# Patient Record
Sex: Female | Born: 1990
Health system: Southern US, Community
[De-identification: ages and names within clinical notes are randomized; demographics above are authoritative.]

## PROBLEM LIST (undated history)

## (undated) DIAGNOSIS — Z87440 Personal history of urinary (tract) infections: Secondary | ICD-10-CM

## (undated) DIAGNOSIS — N946 Dysmenorrhea, unspecified: Secondary | ICD-10-CM

## (undated) HISTORY — DX: Personal history of urinary (tract) infections: Z87.440

## (undated) HISTORY — DX: Dysmenorrhea, unspecified: N94.6

## (undated) HISTORY — PX: NO PAST SURGERIES: SHX2092

---

## 2012-06-09 ENCOUNTER — Other Ambulatory Visit (HOSPITAL_COMMUNITY)
Admission: RE | Admit: 2012-06-09 | Discharge: 2012-06-09 | Disposition: A | Discharge status: Home / Self Care | Payer: BC Managed Care – PPO | Source: Ambulatory Visit | Attending: Internal Medicine | Admitting: Internal Medicine

## 2012-06-09 DIAGNOSIS — Z01419 Encounter for gynecological examination (general) (routine) without abnormal findings: Secondary | ICD-10-CM | POA: Insufficient documentation

## 2012-06-09 DIAGNOSIS — R87810 Cervical high risk human papillomavirus (HPV) DNA test positive: Secondary | ICD-10-CM | POA: Insufficient documentation

## 2012-06-09 DIAGNOSIS — Z1151 Encounter for screening for human papillomavirus (HPV): Secondary | ICD-10-CM | POA: Insufficient documentation

## 2012-12-30 ENCOUNTER — Encounter (HOSPITAL_COMMUNITY): Payer: Self-pay | Admitting: Emergency Medicine

## 2012-12-30 ENCOUNTER — Emergency Department (HOSPITAL_COMMUNITY)
Admission: EM | Admit: 2012-12-30 | Discharge: 2012-12-30 | Disposition: A | Discharge status: Home / Self Care | Payer: BC Managed Care – PPO | Attending: Emergency Medicine | Admitting: Emergency Medicine

## 2012-12-30 DIAGNOSIS — Y939 Activity, unspecified: Secondary | ICD-10-CM | POA: Insufficient documentation

## 2012-12-30 DIAGNOSIS — IMO0001 Reserved for inherently not codable concepts without codable children: Secondary | ICD-10-CM | POA: Insufficient documentation

## 2012-12-30 DIAGNOSIS — IMO0002 Reserved for concepts with insufficient information to code with codable children: Secondary | ICD-10-CM | POA: Insufficient documentation

## 2012-12-30 DIAGNOSIS — W57XXXA Bitten or stung by nonvenomous insect and other nonvenomous arthropods, initial encounter: Secondary | ICD-10-CM

## 2012-12-30 DIAGNOSIS — Y929 Unspecified place or not applicable: Secondary | ICD-10-CM | POA: Insufficient documentation

## 2012-12-30 NOTE — ED Notes (Signed)
Pt. reports itchy insect bites at right 4th toe and left arm last Saturday.

## 2012-12-30 NOTE — ED Provider Notes (Signed)
CSN: [id number]     Arrival date & time 12/30/12  2210 History   First MD Initiated Contact with Patient 12/30/12 2243     Chief Complaint  Patient presents with  . Insect Bite   (Consider location/radiation/quality/duration/timing/severity/associated sxs/prior Treatment) HPI Comments: 22 year old female presents to the emergency department complaining of bug bites to her left arm and right fourth toe x3 days. Patient states 3 days ago she began itching on her left arm and right toe,  Believes she was bit by something. States there are 3 small bumps on her left arm that are very itchy along with the top of her right fourth toe, he became slightly red, she applied topical hydrocortisone cream with some relief. Denies any new soaps, detergents, pets, lotions or recent travel. No contacts with similar rash. Denies any difficulty breathing or swallowing.  The history is provided by the patient.    History reviewed. No pertinent past medical history. History reviewed. No pertinent past surgical history. No family history on file. History  Substance Use Topics  . Smoking status: Never Smoker   . Smokeless tobacco: Not on file  . Alcohol Use: No   OB History   Grav Para Term Preterm Abortions TAB SAB Ect Mult Living                 Review of Systems  Constitutional: Negative for fever and chills.  Musculoskeletal: Negative for myalgias and arthralgias.  Skin: Positive for rash.  All other systems reviewed and are negative.    Allergies  Review of patient's allergies indicates no known allergies.  Home Medications   Current Outpatient Rx  Name  Route  Sig  Dispense  Refill  . Ibuprofen (IBU PO)   Oral   Take 2 tablets by mouth once.          BP 137/96  Pulse 88  Temp(Src) 98.3 F (36.8 C) (Oral)  Resp 14  Ht 5\' 7"  (1.702 m)  Wt 174 lb (78.926 kg)  BMI 27.25 kg/m2  SpO2 100%  LMP 12/01/2012 Physical Exam  Constitutional: She is oriented to person, place, and time.  She appears well-developed and well-nourished. No distress.  HENT:  Head: Normocephalic and atraumatic.  Mouth/Throat: Oropharynx is clear and moist.  Eyes: Conjunctivae are normal.  Neck: Normal range of motion. Neck supple.  Cardiovascular: Normal rate, regular rhythm and normal heart sounds.   Pulmonary/Chest: Effort normal and breath sounds normal.  Musculoskeletal: Normal range of motion. She exhibits no edema.  Neurological: She is alert and oriented to person, place, and time.  Skin: Skin is warm and dry. She is not diaphoretic.  Three approximately 2 mm raised areas consistent with mosquito bite on left arm, similar area on right fourth toe with mild swelling. No tenderness or surrounding cellulitis.  Psychiatric: She has a normal mood and affect. Her behavior is normal.    ED Course  Procedures (including critical care time) Labs Review Labs Reviewed - No data to display Imaging Review No results found.  MDM   1. Mosquito bite    Patient with mosquito bites. Advised continued hydrocortisone cream as she has been getting relief with this. Also discussed Benadryl and cool compresses for symptomatic relief. Return precautions discussed. Patient states understanding of plan and is agreeable.   Trevor Mace, PA-C 12/30/12 2307

## 2012-12-31 NOTE — ED Provider Notes (Signed)
Medical screening examination/treatment/procedure(s) were performed by non-physician practitioner and as supervising physician I was immediately available for consultation/collaboration.   Lakelyn Straus, MD 12/31/12 0512 

## 2016-07-29 ENCOUNTER — Ambulatory Visit: Payer: Self-pay | Admitting: Family Medicine

## 2016-08-15 ENCOUNTER — Encounter: Payer: Self-pay | Admitting: Family Medicine

## 2016-08-15 ENCOUNTER — Ambulatory Visit (INDEPENDENT_AMBULATORY_CARE_PROVIDER_SITE_OTHER): Payer: 59 | Admitting: Family Medicine

## 2016-08-15 VITALS — BP 120/80 | HR 81 | Resp 12 | Ht 67.0 in | Wt 196.4 lb

## 2016-08-15 DIAGNOSIS — N946 Dysmenorrhea, unspecified: Secondary | ICD-10-CM

## 2016-08-15 DIAGNOSIS — G4700 Insomnia, unspecified: Secondary | ICD-10-CM

## 2016-08-15 DIAGNOSIS — H18012 Anterior corneal pigmentations, left eye: Secondary | ICD-10-CM | POA: Diagnosis not present

## 2016-08-15 DIAGNOSIS — E669 Obesity, unspecified: Secondary | ICD-10-CM | POA: Diagnosis not present

## 2016-08-15 DIAGNOSIS — Z6830 Body mass index (BMI) 30.0-30.9, adult: Secondary | ICD-10-CM | POA: Diagnosis not present

## 2016-08-15 MED ORDER — IBUPROFEN 600 MG PO TABS: 600.0000 mg | ORAL_TABLET | Freq: Three times a day (TID) | ORAL | 1 refills | Status: DC | PRN

## 2016-08-15 NOTE — Progress Notes (Signed)
HPI:   [name] is a 26 y.o. female, who is here today to establish care.  Former PCP: Dr Fredric Mare Last preventive routine visit: 07/2014.  Chronic medical problems: Otherwise healthy.   Concerns today:   According to pt, her mother recommended to have thyroid check because she is having a hard time losing wt.  She stated following a healthier diet about 6 weeks ago. In 05/2016 she started with regular exercise. Some examples of foods:  Breakfast: She usually does not eat breakfast, smoothly sometimes. Lunch: subway sandwich, chicken and spinach (she cooked it), yesterday chick filet grilled chicken nuggets. Dinner: Spaghetti with hamburger and tomatoes, [place] burger, salad with chicken and avocado.  Snacks: catchus,sunflower seeds, pretsals, and a lot fruit.   She has lost about 11 Lb since she changed life style habits.   She is also c/o trouble falling asleep, once she is asleep she does not wake up. She has not tried OTC sleep aids. Denies fatigue.   -She is also reporting abnormal pap 2 years ago. She denies abdominal pain, abnormal vaginal bleeding or discharge. Hx of menstrual cramps on day 1 and 2 of her menstrual period, flow is mildly heavy , no clots. She was on OCP's 1-2 years ago and helped.    Review of Systems  Constitutional: Negative for activity change, appetite change, fatigue and fever.  HENT: Negative for mouth sores, nosebleeds and trouble swallowing.   Eyes: Negative for redness and visual disturbance.  Respiratory: Negative for shortness of breath and wheezing.   Cardiovascular: Negative for palpitations and leg swelling.  Gastrointestinal: Negative for abdominal pain, nausea and vomiting.       Negative for changes in bowel habits.  Endocrine: Negative for cold intolerance and heat intolerance.  Genitourinary: Positive for menstrual problem and pelvic pain. Negative for decreased urine volume, dysuria and hematuria.    Musculoskeletal: Negative for back pain and myalgias.  Skin: Negative for pallor and rash.  Neurological: Negative for syncope, weakness and headaches.  Psychiatric/Behavioral: Positive for sleep disturbance. The patient is not nervous/anxious.      No current outpatient prescriptions on file prior to visit.   No current facility-administered medications on file prior to visit.      Past Medical History:  Diagnosis Date  . History of frequent urinary tract infections    No Known Allergies  Family History  Problem Relation Age of Onset  . Hypertension Paternal Uncle   . Diabetes Neg Hx   . Cancer Neg Hx     Social History   Social History  . Marital status: Single    Spouse name: N/A  . Number of children: N/A  . Years of education: N/A   Social History Main Topics  . Smoking status: Never Smoker  . Smokeless tobacco: Never Used  . Alcohol use No  . Drug use: No  . Sexual activity: Not Asked   Other Topics Concern  . None   Social History Narrative  . None    Vitals:   08/15/16 1542  BP: 120/80  Pulse: 81  Resp: 12  O2 sat at RA 98%. Body mass index is 30.76 kg/m.   Physical Exam  Nursing note and vitals reviewed. Constitutional: She is oriented to person, place, and time. She appears well-developed. No distress.  HENT:  Head: Atraumatic.  Mouth/Throat: Oropharynx is clear and moist and mucous membranes are normal.  Eyes: Conjunctivae and EOM are normal. Pupils are equal, round, and reactive  to light.    Left eye pigmentation, at 4 O'clock  Neck: No tracheal deviation present. No thyroid mass and no thyromegaly present.  Cardiovascular: Normal rate and regular rhythm.   No murmur heard. Pulses:      Dorsalis pedis pulses are 2+ on the right side, and 2+ on the left side.  Respiratory: Effort normal and breath sounds normal. No respiratory distress.  GI: Soft. She exhibits no mass. There is no hepatomegaly. There is no tenderness.   Musculoskeletal: She exhibits no edema.  Lymphadenopathy:    She has no cervical adenopathy.  Neurological: She is alert and oriented to person, place, and time. She has normal strength. Gait normal.  Skin: Skin is warm. No rash noted. No erythema.  Psychiatric: She has a normal mood and affect.  Well groomed, poor eye contact.       ASSESSMENT AND PLAN:   [name] was seen today for establish care.  Diagnoses and all orders for this visit:  Lab Results  Component Value Date   TSH 0.82 08/15/2016    Insomnia, unspecified type  Good sleep hygiene discussed. Melatonin OTC 5 mg may also help.  Class 1 obesity without serious comorbidity with body mass index (BMI) of 30.0 to 30.9 in adult, unspecified obesity type  We discussed benefits of wt loss as well as adverse effects of obesity. Consistency with healthy diet and physical activity recommended. She is reporting 11 lb wt loss already, so encouraged to continue with life style changes. F/U in 3 months.   -     TSH  Dysmenorrhea  Treatment options discussed: HT or NSAID's, she prefers the latter one. We discussed some side effects of medication. F/U in 3 months.  -     ibuprofen (ADVIL,MOTRIN) 600 MG tablet; Take 1 tablet (600 mg total) by mouth every 8 (eight) hours as needed for cramping (with menses for 3 days, with food).  Corneal pigmentation of left eye  We discussed findings on exam, she is not sure about duration. Strongly recommend arranging eye exam, she is reporting one done about 2 years ago, she does not recall any conversation about pigmentation.     Betty G. Sw[place]il[place]Jordan, MD  [facility]. [place] office.

## 2016-08-15 NOTE — Patient Instructions (Signed)
A few things to remember from today's visit:   Class 1 obesity without serious comorbidity with body mass index (BMI) of 30.0 to 30.9 in adult, unspecified obesity type - Plan: TSH  Dysmenorrhea - Plan: ibuprofen (ADVIL,MOTRIN) 600 MG tablet  Ibuprofen with menstrual periods and with food.  Please be sure medication list is accurate. If a new problem present, please set up appointment sooner than planned today.

## 2016-08-16 LAB — TSH: TSH: 0.82 u[IU]/mL (ref 0.35–4.50)

## 2016-08-21 ENCOUNTER — Telehealth: Payer: Self-pay | Admitting: General Practice

## 2016-08-21 NOTE — Telephone Encounter (Signed)
She can have a note for today and tomorrow. Treatment was started last OV, keep f/u appt, come earlier if needed to discuss other options.  Thanks, BJ

## 2016-08-21 NOTE — Telephone Encounter (Signed)
Pt would like to have a note for the monthly pain that she is experiencing during her period this was discussed with Dr. SwazilandJordan on Thursday.  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

## 2016-08-21 NOTE — Telephone Encounter (Signed)
Okay for letter

## 2016-08-22 NOTE — Telephone Encounter (Signed)
Letter placed up front for patient to pick up. Patient already made aware it will be up front.

## 2016-09-23 ENCOUNTER — Ambulatory Visit (INDEPENDENT_AMBULATORY_CARE_PROVIDER_SITE_OTHER): Payer: 59 | Admitting: Family Medicine

## 2016-09-23 ENCOUNTER — Encounter: Payer: Self-pay | Admitting: Family Medicine

## 2016-09-23 VITALS — BP 110/76 | HR 80 | Resp 12 | Ht 67.0 in | Wt 195.4 lb

## 2016-09-23 DIAGNOSIS — N946 Dysmenorrhea, unspecified: Secondary | ICD-10-CM

## 2016-09-23 NOTE — Progress Notes (Signed)
HPI:   ACUTE VISIT:  No chief complaint on file.   [name] is a 26 y.o. female, who is here today complaining of pelvic pain that started 1-2 days ago and related with her menstrual period. She has history of dysmenorrhea, last office visit was on 08/15/2016. She did not try Ibuprofen as I recommended last OV. She has taken OCP's in the past and it helped but she is not interested in daily medication and denies sexual activity. She took OTC Pamper at the time pain started, help some.  She had an episode of nausea and vomiting yesterday.  Abdominal Cramping  This is a chronic problem. The current episode started yesterday. The onset quality is gradual. The most recent episode lasted 2 days. The problem has been gradually improving. The pain is located in the suprapubic region. The pain is at a severity of 7/10. The pain is moderate. The quality of the pain is cramping and colicky. The abdominal pain does not radiate. Associated symptoms include nausea and vomiting. Pertinent negatives include no diarrhea, dysuria, fever, frequency, hematochezia or hematuria. The pain is aggravated by palpation. The pain is relieved by nothing. The treatment provided mild relief.   She denies heavy menstrual flow. Suprapubic abdominal pain started on 09/21/16 night and worse yesterday, "much better" today. Symptoms are her typical ones almost every month with menstrual periods. Usually worse the first day of her menstrual period, last 2-3 days.  She is here today because she needs a note to for work, she missed work yesterday.   Review of Systems  Constitutional: Positive for fatigue. Negative for appetite change, chills, fever and unexpected weight change.  Respiratory: Negative for chest tightness and shortness of breath.   Cardiovascular: Negative for leg swelling.  Gastrointestinal: Positive for abdominal pain, nausea and vomiting. Negative for abdominal distention, blood in stool,  diarrhea and hematochezia.  Genitourinary: Positive for pelvic pain. Negative for decreased urine volume, dysuria, frequency, hematuria and vaginal discharge.  Skin: Negative for pallor and rash.  Hematological: Negative for adenopathy. Does not bruise/bleed easily.      Current Outpatient Prescriptions on File Prior to Visit  Medication Sig Dispense Refill  . ibuprofen (ADVIL,MOTRIN) 600 MG tablet Take 1 tablet (600 mg total) by mouth every 8 (eight) hours as needed for cramping (with menses for 3 days, with food). 30 tablet 1   No current facility-administered medications on file prior to visit.      Past Medical History:  Diagnosis Date  . History of frequent urinary tract infections    No Known Allergies  Social History   Social History  . Marital status: Single    Spouse name: N/A  . Number of children: N/A  . Years of education: N/A   Social History Main Topics  . Smoking status: Never Smoker  . Smokeless tobacco: Never Used  . Alcohol use No  . Drug use: No  . Sexual activity: Not Asked   Other Topics Concern  . None   Social History Narrative  . None    Vitals:   09/23/16 1049  BP: 110/76  Pulse: 80  Resp: 12   Body mass index is 30.6 kg/m.  Physical Exam  Nursing note and vitals reviewed. Constitutional: She is oriented to person, place, and time. She appears well-developed. She does not appear ill. No distress.  HENT:  Head: Atraumatic.  Mouth/Throat: Oropharynx is clear and moist and mucous membranes are normal.  Eyes: Conjunctivae are normal.  Cardiovascular: Normal rate and regular rhythm.   No murmur heard. Respiratory: Effort normal and breath sounds normal. No respiratory distress.  GI: Soft. She exhibits no distension and no mass. There is no hepatomegaly. There is no tenderness. There is no CVA tenderness.  Musculoskeletal: She exhibits no edema.  Neurological: She is alert and oriented to person, place, and time. She has normal  strength. Gait normal.  Skin: Skin is warm. No erythema.  Psychiatric: She has a normal mood and affect.  Well groomed, good eye contact.    ASSESSMENT AND PLAN:   Diagnoses and all orders for this visit:  Dysmenorrhea   Educated about diagnosis and treatment options. She still is not interested in OCPs, she would like to try prescription for Ibuprofen, which was sent to the pharmacy last month. She denies sexual activity and describing this episode as her typical pelvic pain with menses, so no further workup was recommended. She was instructed to let me know if she needs a work note for future episodes, usually she needs to be off once per month.  Instructed about warning signs.     Tracie Knight G. [place], MD  [facility]. [place] office.

## 2016-09-23 NOTE — Patient Instructions (Signed)
A few things to remember from today's visit:   Dysmenorrhea Dysmenorrhea Menstrual cramps (dysmenorrhea) are caused by the muscles of the uterus tightening (contracting) during a menstrual period. For some women, this discomfort is merely bothersome. For others, dysmenorrhea can be severe enough to interfere with everyday activities for a few days each month. Primary dysmenorrhea is menstrual cramps that last a couple of days when you start having menstrual periods or soon after. This often begins after a teenager starts having her period. As a woman gets older or has a baby, the cramps will usually lessen or disappear. Secondary dysmenorrhea begins later in life, lasts longer, and the pain may be stronger than primary dysmenorrhea. The pain may start before the period and last a few days after the period. What are the causes? Dysmenorrhea is usually caused by an underlying problem, such as:  The tissue lining the uterus grows outside of the uterus in other areas of the body (endometriosis).  The endometrial tissue, which normally lines the uterus, is found in or grows into the muscular walls of the uterus (adenomyosis).  The pelvic blood vessels are engorged with blood just before the menstrual period (pelvic congestive syndrome).  Overgrowth of cells (polyps) in the lining of the uterus or cervix.  Falling down of the uterus (prolapse) because of loose or stretched ligaments.  Depression.  Bladder problems, infection, or inflammation.  Problems with the intestine, a tumor, or irritable bowel syndrome.  Cancer of the female organs or bladder.  A severely tipped uterus.  A very tight opening or closed cervix.  Noncancerous tumors of the uterus (fibroids).  Pelvic inflammatory disease (PID).  Pelvic scarring (adhesions) from a previous surgery.  Ovarian cyst.  An intrauterine device (IUD) used for birth control.  What increases the risk? You may be at greater risk of  dysmenorrhea if:  You are younger than age 28.  You started puberty early.  You have irregular or heavy bleeding.  You have never given birth.  You have a family history of this problem.  You are a smoker.  What are the signs or symptoms?  Cramping or throbbing pain in your lower abdomen.  Headaches.  Lower back pain.  Nausea or vomiting.  Diarrhea.  Sweating or dizziness.  Loose stools. How is this diagnosed? A diagnosis is based on your history, symptoms, physical exam, diagnostic tests, or procedures. Diagnostic tests or procedures may include:  Blood tests.  Ultrasonography.  An examination of the lining of the uterus (dilation and curettage, D&C).    How is this treated? Treatment depends on the cause of the dysmenorrhea. Treatment may include:  Pain medicine prescribed by your health care provider.  Birth control pills or an IUD with progesterone hormone in it.  Hormone replacement therapy.  Nonsteroidal anti-inflammatory drugs (NSAIDs). These may help stop the production of prostaglandins.  Surgery to remove adhesions, endometriosis, ovarian cyst, or fibroids.  Removal of the uterus (hysterectomy).  Progesterone shots to stop the menstrual period.  Cutting the nerves on the sacrum that go to the female organs (presacral neurectomy).  Electric current to the sacral nerves (sacral nerve stimulation).  Antidepressant medicine.  Psychiatric therapy, counseling, or group therapy.  Exercise and physical therapy.  Meditation and yoga therapy.  Acupuncture.  Follow these instructions at home:  Only take over-the-counter or prescription medicines as directed by your health care provider.  Place a heating pad or hot water bottle on your lower back or abdomen. Do not sleep with the  heating pad.  Use aerobic exercises, walking, swimming, biking, and other exercises to help lessen the cramping.  Massage to the lower back or abdomen may  help.  Stop smoking.  Avoid alcohol and caffeine. Contact a health care provider if:  Your pain does not get better with medicine.  You have pain with sexual intercourse.  Your pain increases and is not controlled with medicines.  You have abnormal vaginal bleeding with your period.  You develop nausea or vomiting with your period that is not controlled with medicine. Get help right away if: You pass out. This information is not intended to replace advice given to you by your health care provider. Make sure you discuss any questions you have with your health care provider. Document Released: 03/18/2005 Document Revised: 08/24/2015 Document Reviewed: 09/03/2012 Elsevier Interactive Patient Education  2017 [employer]   Please be sure medication list is accurate. If a new problem present, please set up appointment sooner than planned today.

## 2016-11-14 NOTE — Progress Notes (Signed)
HPI:   Tracie Knight is a 26 y.o. female, who is here today for her routine physical.  Last seen on 09/23/16. Last CPE in 07/2014.  Regular exercise 3 or more time per week: Not consistently Following a healthy diet: Trying to eat healthier.   Chronic medical problems: Dysmenorrhea, obesity,and insomnia among some.  FLP in 06/2012: TC 136,HDL 65,LDL 60,and TG 57.  Pap smear 1-2 years ago. Hx of abnormal pap smears: 3 years ago. Colpo Bx done was negative.  Hx of STD's: Denies.  Sexually active, she is in a stable relation for 9 years now She is not on any birth control.  Hx of dysmenorrhea and reporting menstrual flow as "normal" but occasionally she has "small clots" during first or second day. M:12. G:0  LMP 10/18/16  She has not had eye exam as recommended to follow on iris pigmentation changes, reported as stable.  Review of Systems  Constitutional: Positive for fatigue. Negative for activity change, appetite change, fever and unexpected weight change.  HENT: Negative for dental problem, hearing loss, nosebleeds, sore throat, trouble swallowing and voice change.   Eyes: Negative for redness and visual disturbance.  Respiratory: Negative for cough, shortness of breath and wheezing.   Cardiovascular: Negative for chest pain, palpitations and leg swelling.  Gastrointestinal: Negative for abdominal pain, blood in stool, nausea and vomiting.       No changes in bowel habits.  Endocrine: Negative for cold intolerance, heat intolerance, polydipsia, polyphagia and polyuria.  Genitourinary: Positive for menstrual problem. Negative for decreased urine volume, dyspareunia, dysuria, genital sores, hematuria, vaginal bleeding and vaginal discharge.       No breast tenderness or nipple discharge.  Musculoskeletal: Negative for gait problem and myalgias.  Skin: Negative for pallor and rash.  Allergic/Immunologic: Negative for environmental allergies.  Neurological:  Negative for syncope, weakness and headaches.  Hematological: Negative for adenopathy. Does not bruise/bleed easily.  Psychiatric/Behavioral: Positive for sleep disturbance. Negative for confusion. The patient is not nervous/anxious.   All other systems reviewed and are negative.     No current outpatient prescriptions on file prior to visit.   No current facility-administered medications on file prior to visit.      Past Medical History:  Diagnosis Date  . Dysmenorrhea   . History of frequent urinary tract infections    No past surgical history on file.  No Known Allergies  Family History  Problem Relation Age of Onset  . Hypertension Paternal Uncle   . Diabetes Neg Hx   . Cancer Neg Hx     Social History   Social History  . Marital status: Single    Spouse name: N/A  . Number of children: N/A  . Years of education: N/A   Social History Main Topics  . Smoking status: Never Smoker  . Smokeless tobacco: Never Used  . Alcohol use No  . Drug use: No  . Sexual activity: Not Asked   Other Topics Concern  . None   Social History Narrative  . None     Vitals:   11/15/16 1408  BP: 122/80  Pulse: 100  Resp: 12  SpO2: 98%   Body mass index is 30.6 kg/m.   Wt Readings from Last 3 Encounters:  11/15/16 195 lb 6 oz (88.6 kg)  09/23/16 195 lb 6 oz (88.6 kg)  08/15/16 196 lb 6 oz (89.1 kg)     Physical Exam  Nursing note and vitals reviewed. Constitutional: She is  oriented to person, place, and time. She appears well-developed. No distress.  HENT:  Head: Normocephalic and atraumatic.  Right Ear: Hearing, tympanic membrane, external ear and ear canal normal.  Left Ear: Hearing, tympanic membrane, external ear and ear canal normal.  Mouth/Throat: Uvula is midline, oropharynx is clear and moist and mucous membranes are normal.  Eyes: Pupils are equal, round, and reactive to light. Conjunctivae and EOM are normal.  Left iris pigmentation at 4 O'clock,  stable  Neck: No tracheal deviation present. No thyroid mass and no thyromegaly present.  Cardiovascular: Normal rate and regular rhythm.   No murmur heard. Pulses:      Dorsalis pedis pulses are 2+ on the right side, and 2+ on the left side.  Respiratory: Effort normal and breath sounds normal. No respiratory distress.  GI: Soft. She exhibits no mass. There is no hepatomegaly. There is no tenderness.  Genitourinary: No breast swelling or tenderness. There is no rash, tenderness or lesion on the right labia. There is no rash, tenderness or lesion on the left labia. Uterus is not enlarged and not tender. Cervix exhibits discharge (mild, whitish). Cervix exhibits no motion tenderness and no friability. Right adnexum displays no mass and no tenderness. Left adnexum displays no mass and no tenderness. No erythema or bleeding in the vagina. No vaginal discharge found.  Genitourinary Comments: Breast: no masses or nipple discharge bilateral. Pap smear collected.  Musculoskeletal: She exhibits no edema or tenderness.  No major deformity or signs of synovitis appreciated.  Lymphadenopathy:    She has no cervical adenopathy.       Right: No inguinal and no supraclavicular adenopathy present.       Left: No inguinal and no supraclavicular adenopathy present.  Neurological: She is alert and oriented to person, place, and time. She has normal strength. No cranial nerve deficit. Coordination and gait normal.  Reflex Scores:      Bicep reflexes are 2+ on the right side and 2+ on the left side.      Patellar reflexes are 2+ on the right side and 2+ on the left side. Skin: Skin is warm. No rash noted. No erythema.  Psychiatric: She has a normal mood and affect. Cognition and memory are normal.  Well groomed, poor eye contact.     ASSESSMENT AND PLAN:   [name] was seen today for annual exam and gynecologic exam.  Diagnoses and all orders for this visit:  Lab Results  Component Value Date   HGBA1C  5.8 11/15/2016   Lab Results  Component Value Date   WBC 6.8 11/15/2016   HGB 12.9 11/15/2016   HCT 40.5 11/15/2016   MCV 70.7 (L) 11/15/2016   PLT 228.0 11/15/2016    Routine general medical examination at a health care facility   We discussed the importance of regular physical activity and healthy diet for prevention of chronic illness and/or complications. Preventive guidelines reviewed. Vaccination reported as up to date. STD prevention. Folic acid supplementation. Eye exam recommended. Family planning discussed, we discussed a few options.She is not interested in any.  Next CPE in 1-2 years.  Diabetes mellitus screening -     Hemoglobin A1c  Class 1 obesity without serious comorbidity with body mass index (BMI) of 30.0 to 30.9 in adult, unspecified obesity type  Stable wt. We discussed benefits of wt loss as well as adverse effects of obesity. Consistency with healthy diet and physical activity recommended.  Dysmenorrhea  Stable. She is not interested in OCP's or  Mirena. Further recommendations will be given according to lab results.  -     CBC  Cervical cancer screening -     Cytology - PAP ([place])  Screen for STD (sexually transmitted disease) -     Cytology - PAP ([place])      Return in 1 year (on 11/15/2017) for routine with pap.          Betty G. Swaziland, MD  [facility]. [place] office.

## 2016-11-15 ENCOUNTER — Ambulatory Visit (INDEPENDENT_AMBULATORY_CARE_PROVIDER_SITE_OTHER): Payer: 59 | Admitting: Family Medicine

## 2016-11-15 ENCOUNTER — Other Ambulatory Visit (HOSPITAL_COMMUNITY)
Admission: RE | Admit: 2016-11-15 | Discharge: 2016-11-15 | Disposition: A | Discharge status: Home / Self Care | Payer: 59 | Source: Ambulatory Visit | Attending: Family Medicine | Admitting: Family Medicine

## 2016-11-15 ENCOUNTER — Encounter: Payer: Self-pay | Admitting: Family Medicine

## 2016-11-15 VITALS — BP 122/80 | HR 100 | Resp 12 | Ht 67.0 in | Wt 195.4 lb

## 2016-11-15 DIAGNOSIS — Z124 Encounter for screening for malignant neoplasm of cervix: Secondary | ICD-10-CM | POA: Insufficient documentation

## 2016-11-15 DIAGNOSIS — Z113 Encounter for screening for infections with a predominantly sexual mode of transmission: Secondary | ICD-10-CM | POA: Diagnosis not present

## 2016-11-15 DIAGNOSIS — N946 Dysmenorrhea, unspecified: Secondary | ICD-10-CM | POA: Diagnosis not present

## 2016-11-15 DIAGNOSIS — Z131 Encounter for screening for diabetes mellitus: Secondary | ICD-10-CM

## 2016-11-15 DIAGNOSIS — E669 Obesity, unspecified: Secondary | ICD-10-CM | POA: Diagnosis not present

## 2016-11-15 DIAGNOSIS — Z6830 Body mass index (BMI) 30.0-30.9, adult: Secondary | ICD-10-CM

## 2016-11-15 DIAGNOSIS — Z0000 Encounter for general adult medical examination without abnormal findings: Secondary | ICD-10-CM

## 2016-11-15 LAB — CBC
HCT: 40.5 % (ref 36.0–46.0)
Hemoglobin: 12.9 g/dL (ref 12.0–15.0)
MCHC: 31.8 g/dL (ref 30.0–36.0)
MCV: 70.7 fl — ABNORMAL LOW (ref 78.0–100.0)
Platelets: 228 10*3/uL (ref 150.0–400.0)
RBC: 5.74 Mil/uL — ABNORMAL HIGH (ref 3.87–5.11)
RDW: 15.1 % (ref 11.5–15.5)
WBC: 6.8 10*3/uL (ref 4.0–10.5)

## 2016-11-15 LAB — HEMOGLOBIN A1C: Hgb A1c MFr Bld: 5.8 % (ref 4.6–6.5)

## 2016-11-15 NOTE — Patient Instructions (Addendum)
A few things to remember from today's visit:   Routine general medical examination at a health care facility  Diabetes mellitus screening - Plan: Hemoglobin A1c  Screening for lipid disorders  Class 1 obesity without serious comorbidity with body mass index (BMI) of 30.0 to 30.9 in adult, unspecified obesity type  Dysmenorrhea - Plan: CBC  Cervical cancer screening - Plan: Cytology - PAP ([place])     At least 150 minutes of moderate exercise per week, daily brisk walking for 15-30 min is a good exercise option. Healthy diet low in saturated (animal) fats and sweets and consisting of fresh fruits and vegetables, lean meats such as fish and white chicken and whole grains.   - Vaccines:  Tdap vaccine every 10 years.  Shingles vaccine recommended at age 47, could be given after 28 years of age but not sure about insurance coverage.  Pneumonia vaccines:  Prevnar 13 at 65 and Pneumovax at 66.  Screening recommendations for low/normal risk women:  Screening for diabetes at age 20-45 and every 3 years.  Cervical cancer prevention:  -HPV vaccination between 73-19 years old. -Pap smear starts at 28 years of age and continues periodically until 28 years old in low risk women. Pap smear every 3 years between 52 and 56 years old. Pap smear every 3 years between women 30 and older if pap smear negative and HPV screening negative.   -Breast cancer: Mammogram: 28 years old.     Colon cancer screening: starts at 29 years old until 27 years old.  Cholesterol disorder screening at age 73 and every 3 years.  Also recommended:  1. Dental visit- Brush and floss your teeth twice daily; visit your dentist twice a year. 2. Eye doctor- Get an eye exam at least every 2 years. 3. Helmet use- Always wear a helmet when riding a bicycle, motorcycle, rollerblading or skateboarding. 4. Safe sex- If you may be exposed to sexually transmitted infections, use a condom. 5. Seat belts- Seat belts  can save your live; always wear one. 6. Smoke/Carbon Monoxide detectors- These detectors need to be installed on the appropriate level of your home. Replace batteries at least once a year. 7. Skin cancer- When out in the sun please cover up and use sunscreen 15 SPF or higher. 8. Violence- If anyone is threatening or hurting you, please tell your healthcare provider.  9. Drink alcohol in moderation- Limit alcohol intake to one drink or less per day. Never drink and drive.    Contraception Choices Contraception (birth control) is the use of any methods or devices to prevent pregnancy. Below are some methods to help avoid pregnancy. Hormonal methods  Contraceptive implant. This is a thin, plastic tube containing progesterone hormone. It does not contain estrogen hormone. Your health care provider inserts the tube in the inner part of the upper arm. The tube can remain in place for up to 3 years. After 3 years, the implant must be removed. The implant prevents the ovaries from releasing an egg (ovulation), thickens the cervical mucus to prevent sperm from entering the uterus, and thins the lining of the inside of the uterus.  Progesterone-only injections. These injections are given every 3 months by your health care provider to prevent pregnancy. This synthetic progesterone hormone stops the ovaries from releasing eggs. It also thickens cervical mucus and changes the uterine lining. This makes it harder for sperm to survive in the uterus.  Birth control pills. These pills contain estrogen and progesterone hormone. They work by  preventing the ovaries from releasing eggs (ovulation). They also cause the cervical mucus to thicken, preventing the sperm from entering the uterus. Birth control pills are prescribed by a health care provider.Birth control pills can also be used to treat heavy periods.  Minipill. This type of birth control pill contains only the progesterone hormone. They are taken every day  of each month and must be prescribed by your health care provider.  Birth control patch. The patch contains hormones similar to those in birth control pills. It must be changed once a week and is prescribed by a health care provider.  Vaginal ring. The ring contains hormones similar to those in birth control pills. It is left in the vagina for 3 weeks, removed for 1 week, and then a new one is put back in place. The patient must be comfortable inserting and removing the ring from the vagina.A health care provider's prescription is necessary.  Emergency contraception. Emergency contraceptives prevent pregnancy after unprotected sexual intercourse. This pill can be taken right after sex or up to 5 days after unprotected sex. It is most effective the sooner you take the pills after having sexual intercourse. Most emergency contraceptive pills are available without a prescription. Check with your pharmacist. Do not use emergency contraception as your only form of birth control. Barrier methods  Female condom. This is a thin sheath (latex or rubber) that is worn over the penis during sexual intercourse. It can be used with spermicide to increase effectiveness.  Female condom. This is a soft, loose-fitting sheath that is put into the vagina before sexual intercourse.  Diaphragm. This is a soft, latex, dome-shaped barrier that must be fitted by a health care provider. It is inserted into the vagina, along with a spermicidal jelly. It is inserted before intercourse. The diaphragm should be left in the vagina for 6 to 8 hours after intercourse.  Cervical cap. This is a round, soft, latex or plastic cup that fits over the cervix and must be fitted by a health care provider. The cap can be left in place for up to 48 hours after intercourse.  Sponge. This is a soft, circular piece of polyurethane foam. The sponge has spermicide in it. It is inserted into the vagina after wetting it and before sexual  intercourse.  Spermicides. These are chemicals that kill or block sperm from entering the cervix and uterus. They come in the form of creams, jellies, suppositories, foam, or tablets. They do not require a prescription. They are inserted into the vagina with an applicator before having sexual intercourse. The process must be repeated every time you have sexual intercourse. Intrauterine contraception  Intrauterine device (IUD). This is a T-shaped device that is put in a woman's uterus during a menstrual period to prevent pregnancy. There are 2 types: ? Copper IUD. This type of IUD is wrapped in copper wire and is placed inside the uterus. Copper makes the uterus and fallopian tubes produce a fluid that kills sperm. It can stay in place for 10 years. ? Hormone IUD. This type of IUD contains the hormone progestin (synthetic progesterone). The hormone thickens the cervical mucus and prevents sperm from entering the uterus, and it also thins the uterine lining to prevent implantation of a fertilized egg. The hormone can weaken or kill the sperm that get into the uterus. It can stay in place for 3-5 years, depending on which type of IUD is used. Permanent methods of contraception  Female tubal ligation. This is when  the woman's fallopian tubes are surgically sealed, tied, or blocked to prevent the egg from traveling to the uterus.  Hysteroscopic sterilization. This involves placing a small coil or insert into each fallopian tube. Your doctor uses a technique called hysteroscopy to do the procedure. The device causes scar tissue to form. This results in permanent blockage of the fallopian tubes, so the sperm cannot fertilize the egg. It takes about 3 months after the procedure for the tubes to become blocked. You must use another form of birth control for these 3 months.  Female sterilization. This is when the female has the tubes that carry sperm tied off (vasectomy).This blocks sperm from entering the vagina  during sexual intercourse. After the procedure, the man can still ejaculate fluid (semen). Natural planning methods  Natural family planning. This is not having sexual intercourse or using a barrier method (condom, diaphragm, cervical cap) on days the woman could become pregnant.  Calendar method. This is keeping track of the length of each menstrual cycle and identifying when you are fertile.  Ovulation method. This is avoiding sexual intercourse during ovulation.  Symptothermal method. This is avoiding sexual intercourse during ovulation, using a thermometer and ovulation symptoms.  Post-ovulation method. This is timing sexual intercourse after you have ovulated. Regardless of which type or method of contraception you choose, it is important that you use condoms to protect against the transmission of sexually transmitted infections (STIs). Talk with your health care provider about which form of contraception is most appropriate for you. This information is not intended to replace advice given to you by your health care provider. Make sure you discuss any questions you have with your health care provider. Document Released: 03/18/2005 Document Revised: 08/24/2015 Document Reviewed: 09/10/2012 Elsevier Interactive Patient Education  2017 [employer].   Please be sure medication list is accurate. If a new problem present, please set up appointment sooner than planned today.

## 2016-11-16 ENCOUNTER — Encounter: Payer: Self-pay | Admitting: Family Medicine

## 2016-11-19 LAB — CYTOLOGY - PAP
CHLAMYDIA, DNA PROBE: NEGATIVE
Diagnosis: NEGATIVE
NEISSERIA GONORRHEA: NEGATIVE
TRICH (WINDOWPATH): NEGATIVE

## 2016-12-11 DIAGNOSIS — H10023 Other mucopurulent conjunctivitis, bilateral: Secondary | ICD-10-CM | POA: Diagnosis not present

## 2016-12-17 ENCOUNTER — Telehealth: Payer: Self-pay | Admitting: General Practice

## 2016-12-17 NOTE — Telephone Encounter (Signed)
I left a voicemail for patient letting her know the letter is up front for her to pick up.

## 2016-12-17 NOTE — Telephone Encounter (Signed)
Pt needs a work note to be excuse from work on 9-16 due to heavy periods. Pt would like to pick up note today. Pt is aware can take up to 3 business day

## 2017-01-13 ENCOUNTER — Telehealth: Payer: Self-pay | Admitting: Family Medicine

## 2017-01-13 NOTE — Telephone Encounter (Signed)
I left a voicemail for patient letting her know that the letter is up front & ready for pick up.

## 2017-01-13 NOTE — Telephone Encounter (Signed)
Pt is calling needing a work note that she stated that Dr. Swaziland said she would provide if it is needed.

## 2017-02-11 ENCOUNTER — Telehealth: Payer: Self-pay | Admitting: *Deleted

## 2017-02-11 NOTE — Telephone Encounter (Signed)
Copied from CRM [id number]. Topic: General - Other >> Feb 11, 2017 10:49 AM Crist InfanteHarrald, Kathy J wrote: Pt states Dr SwazilandJordan advised her she could get a work note if she has cramps.  Pt does have menstrual cramps and is requesting a work note for today, 02/11/2017

## 2017-02-11 NOTE — Telephone Encounter (Signed)
It is Ok to provide excuse note.  Thanks, BJ

## 2017-02-12 ENCOUNTER — Encounter: Payer: Self-pay | Admitting: *Deleted

## 2017-02-12 NOTE — Telephone Encounter (Signed)
Left message informing patient that work note will be at front for pick up.

## 2017-02-28 ENCOUNTER — Encounter: Payer: 59 | Admitting: Family Medicine

## 2017-04-01 NOTE — L&D Delivery Note (Addendum)
[facility] Delivery Note  Tracie Knight is a 27 y.o. G1P0 s/p SVD at [redacted]w[redacted]d. She was admitted for IOL for gHTN.   ROM: 6h 47m with clear fluid GBS Status: negative Maximum Maternal Temperature: 98.89F  Labor Progress: . Augmentation with cytotec, FB and pitocin . Epidural in place  Delivery Date/Time: 1403 Delivery: Called to room and patient was complete and pushing. Head delivered ROA. No nuchal cord present. Shoulder and body delivered in usual fashion. Infant with spontaneous cry, placed on mother's abdomen, dried and stimulated. Cord clamped x 2 after 1-minute delay, and cut by Dr. Darin Engels. Cord blood drawn. Placenta delivered spontaneously with gentle cord traction. Fundus firm with massage and Pitocin. Labia, perineum, vagina, and cervix inspected inspected with hemostatic periurethral laceration and 2nd degree perineal laceration. 2nd degree laceration repaired with 3.0 Vicryl in the usual fashion with hemostasis after repair.    Placenta: intact, spontaneous Complications: none Lacerations: periurethral and 2nd degree  EBL:  Infant: vigorous female  APGARs 8 and 9  weight pending   Burman Nieves, MD Family Medicine Resident     I attest that I gloved and present in this delivery and I agree with the above documentation and findings.   Luna Kitchens

## 2017-05-16 ENCOUNTER — Telehealth: Payer: Self-pay | Admitting: Family Medicine

## 2017-05-16 ENCOUNTER — Encounter: Payer: Self-pay | Admitting: *Deleted

## 2017-05-16 NOTE — Telephone Encounter (Signed)
Tried calling patient back twice with no answer, unable to leave message, mailbox full. Work note is up front, ready for pick-up.

## 2017-05-16 NOTE — Telephone Encounter (Signed)
Copied from CRM [id number]. Topic: Inquiry >> May 16, 2017 12:05 PM Joana ReamerWarren, Amber N wrote: Reason for CRM: Patient called and request letter to excuse her from work today for her ''period." Per patient Dr. SwazilandJordan told her to call and they would give her a letter any time. Please advise.

## 2017-06-17 ENCOUNTER — Telehealth: Payer: Self-pay | Admitting: Family Medicine

## 2017-06-17 ENCOUNTER — Encounter: Payer: Self-pay | Admitting: *Deleted

## 2017-06-17 NOTE — Telephone Encounter (Signed)
Left message informing patient that work note is ready for pick up and will be at the front desk.

## 2017-06-17 NOTE — Telephone Encounter (Signed)
Copied from CRM [id number]. Topic: Quick Communication - See Telephone Encounter >> Jun 17, 2017 10:54 AM Diana EvesHoyt, Maryann B wrote: CRM for notification. See Telephone encounter for:  Pt requesting a doctor note for today due to menstrual cramps today. She states she only needs the note for today.  06/17/17.

## 2017-07-07 ENCOUNTER — Encounter: Payer: Self-pay | Admitting: *Deleted

## 2017-07-07 ENCOUNTER — Ambulatory Visit (INDEPENDENT_AMBULATORY_CARE_PROVIDER_SITE_OTHER): Payer: 59 | Admitting: Family Medicine

## 2017-07-07 ENCOUNTER — Encounter: Payer: Self-pay | Admitting: Family Medicine

## 2017-07-07 VITALS — BP 118/66 | HR 85 | Temp 98.2°F | Resp 12 | Ht 67.0 in | Wt 201.5 lb

## 2017-07-07 DIAGNOSIS — Z3201 Encounter for pregnancy test, result positive: Secondary | ICD-10-CM | POA: Diagnosis not present

## 2017-07-07 DIAGNOSIS — N926 Irregular menstruation, unspecified: Secondary | ICD-10-CM

## 2017-07-07 DIAGNOSIS — O219 Vomiting of pregnancy, unspecified: Secondary | ICD-10-CM

## 2017-07-07 LAB — POCT URINE PREGNANCY: Preg Test, Ur: POSITIVE — AB

## 2017-07-07 NOTE — Patient Instructions (Addendum)
A few things to remember from today's visit:   Missed menses - Plan: POCT urine pregnancy  Positive urine pregnancy test - Plan: Ambulatory referral to Obstetrics / Gynecology  Nausea and vomiting in adult  Benadryl 25 mg 2-3 times per day may help with nausea and vomiting. Small and frequent fluid sip.  Start prenatal vit.   Morning Sickness Morning sickness is when you feel sick to your stomach (nauseous) during pregnancy. You may feel sick to your stomach and throw up (vomit). You may feel sick in the morning, but you can feel this way any time of day. Some women feel very sick to their stomach and cannot stop throwing up (hyperemesis gravidarum). Follow these instructions at home:  Only take medicines as told by your doctor.  Take multivitamins as told by your doctor. Taking multivitamins before getting pregnant can stop or lessen the harshness of morning sickness.  Eat dry toast or unsalted crackers before getting out of bed.  Eat 5 to 6 small meals a day.  Eat dry and bland foods like rice and baked potatoes.  Do not drink liquids with meals. Drink between meals.  Do not eat greasy, fatty, or spicy foods.  Have someone cook for you if the smell of food causes you to feel sick or throw up.  If you feel sick to your stomach after taking prenatal vitamins, take them at night or with a snack.  Eat protein when you need a snack (nuts, yogurt, cheese).  Eat unsweetened gelatins for dessert.  Wear a bracelet used for sea sickness (acupressure wristband).  Go to a doctor that puts thin needles into certain body points (acupuncture) to improve how you feel.  Do not smoke.  Use a humidifier to keep the air in your house free of odors.  Get lots of fresh air. Contact a doctor if:  You need medicine to feel better.  You feel dizzy or lightheaded.  You are losing weight. Get help right away if:  You feel very sick to your stomach and cannot stop throwing up.  You  pass out (faint). This information is not intended to replace advice given to you by your health care provider. Make sure you discuss any questions you have with your health care provider. Document Released: 04/25/2004 Document Revised: 08/24/2015 Document Reviewed: 09/02/2012 Elsevier Interactive Patient Education  2017 [employer] Inc.  Please be sure medication list is accurate. If a new problem present, please set up appointment sooner than planned today.

## 2017-07-07 NOTE — Progress Notes (Signed)
ACUTE VISIT   HPI:  Chief Complaint  Patient presents with  . Possible Pregnancy    [name] is a 27 y.o. female, who is here today complaining of missing her menstrual period in 05/2017. Reporting a positive home pregnancy test about 2-3 weeks ago. She is sexually active, not using birth control. She is not on folic acid  supplementation or prenatal vit.  She has had mild urinary frequency, unusual cravings, nausea and vomiting in the morning.  She denies fever, chills, abdominal pain, dysuria, gross hematuria, vaginal bleeding, or vaginal discharge.  G:0  Nausea and vomiting worse in the morning. She has about 1-2 episodes of emesis. She has not tried OTC medication.    Review of Systems  Constitutional: Positive for appetite change. Negative for chills and fever.  HENT: Negative for sore throat and trouble swallowing.   Respiratory: Negative for shortness of breath and wheezing.   Cardiovascular: Negative for chest pain, palpitations and leg swelling.  Gastrointestinal: Positive for nausea and vomiting. Negative for abdominal pain.  Genitourinary: Negative for decreased urine volume, dysuria, hematuria, pelvic pain, vaginal bleeding and vaginal discharge.  Musculoskeletal: Negative for back pain and myalgias.  Skin: Negative for pallor and rash.  Neurological: Negative for dizziness, syncope, weakness and headaches.  Psychiatric/Behavioral: Negative for confusion. The patient is nervous/anxious.       No current outpatient medications on file prior to visit.   No current facility-administered medications on file prior to visit.      Past Medical History:  Diagnosis Date  . Dysmenorrhea   . History of frequent urinary tract infections    No Known Allergies  Social History   Socioeconomic History  . Marital status: Single    Spouse name: Not on file  . Number of children: Not on file  . Years of education: Not on file  . Highest  education level: Not on file  Occupational History  . Not on file  Social Needs  . Financial resource strain: Not on file  . Food insecurity:    Worry: Not on file    Inability: Not on file  . Transportation needs:    Medical: Not on file    Non-medical: Not on file  Tobacco Use  . Smoking status: Never Smoker  . Smokeless tobacco: Never Used  Substance and Sexual Activity  . Alcohol use: No  . Drug use: No  . Sexual activity: Not on file  Lifestyle  . Physical activity:    Days per week: Not on file    Minutes per session: Not on file  . Stress: Not on file  Relationships  . Social connections:    Talks on phone: Not on file    Gets together: Not on file    Attends religious service: Not on file    Active member of club or organization: Not on file    Attends meetings of clubs or organizations: Not on file    Relationship status: Not on file  Other Topics Concern  . Not on file  Social History Narrative  . Not on file    Vitals:   07/07/17 0813  BP: 118/66  Pulse: 85  Resp: 12  Temp: 98.2 F (36.8 C)  SpO2: 99%   Body mass index is 31.56 kg/m.    Physical Exam  Nursing note and vitals reviewed. Constitutional: She is oriented to person, place, and time. She appears well-developed. She does not appear ill. No distress.  HENT:  Head: Normocephalic and atraumatic.  Mouth/Throat: Oropharynx is clear and moist and mucous membranes are normal.  Eyes: Conjunctivae are normal.  Cardiovascular: Normal rate and regular rhythm.  No murmur heard. Respiratory: Effort normal and breath sounds normal. No respiratory distress.  GI: Soft. Bowel sounds are normal. She exhibits no distension and no mass. There is no hepatomegaly. There is no tenderness.  Musculoskeletal: She exhibits no edema.  Lymphadenopathy:    She has no cervical adenopathy.  Neurological: She is alert and oriented to person, place, and time. She has normal strength. Gait normal.  Skin: Skin is  warm. No rash noted. No erythema.  Psychiatric: She has a normal mood and affect.  Well groomed, good eye contact.     ASSESSMENT AND PLAN:  Ms. Tracie Knight was seen today for possible pregnancy.  Diagnoses and all orders for this visit:  Missed menses -     POCT urine pregnancy  Positive urine pregnancy test  States that it was not planned but she is in a stable relationship of 6+ years,so she is planning on continuing with pregnancy. Prenatal vit recommended and appt with ob will be arranged. Instructed about warning signs.  -     Ambulatory referral to Obstetrics / Gynecology  Nausea and vomiting during pregnancy  We discussed a few OTC treatment options. Benadryl 25 mg tid may help, we discussed side effects. Adequate hydration, small and frequent sips throughout the day. Clearly instructed about warning signs.    -[name] was advised to seek immediate medical attention if sudden worsening symptoms.      Tyyne Cliett G. [place], MD  [facility]. [place] office.

## 2017-07-24 ENCOUNTER — Encounter: Payer: Self-pay | Admitting: Obstetrics and Gynecology

## 2017-07-30 DIAGNOSIS — Z3401 Encounter for supervision of normal first pregnancy, first trimester: Secondary | ICD-10-CM | POA: Diagnosis not present

## 2017-08-04 DIAGNOSIS — Z1388 Encounter for screening for disorder due to exposure to contaminants: Secondary | ICD-10-CM | POA: Diagnosis not present

## 2017-08-04 DIAGNOSIS — O99019 Anemia complicating pregnancy, unspecified trimester: Secondary | ICD-10-CM | POA: Diagnosis not present

## 2017-08-04 DIAGNOSIS — Z3401 Encounter for supervision of normal first pregnancy, first trimester: Secondary | ICD-10-CM | POA: Diagnosis not present

## 2017-08-04 DIAGNOSIS — Z0389 Encounter for observation for other suspected diseases and conditions ruled out: Secondary | ICD-10-CM | POA: Diagnosis not present

## 2017-08-04 DIAGNOSIS — Z3009 Encounter for other general counseling and advice on contraception: Secondary | ICD-10-CM | POA: Diagnosis not present

## 2017-08-04 LAB — OB RESULTS CONSOLE RUBELLA ANTIBODY, IGM: RUBELLA: IMMUNE

## 2017-08-04 LAB — OB RESULTS CONSOLE GC/CHLAMYDIA
Chlamydia: NEGATIVE
GC PROBE AMP, GENITAL: NEGATIVE

## 2017-08-04 LAB — OB RESULTS CONSOLE ANTIBODY SCREEN: ANTIBODY SCREEN: NEGATIVE

## 2017-08-04 LAB — OB RESULTS CONSOLE HEPATITIS B SURFACE ANTIGEN: Hepatitis B Surface Ag: NEGATIVE

## 2017-08-04 LAB — OB RESULTS CONSOLE ABO/RH: RH Type: POSITIVE

## 2017-08-04 LAB — OB RESULTS CONSOLE HIV ANTIBODY (ROUTINE TESTING): HIV: NONREACTIVE

## 2017-08-04 LAB — OB RESULTS CONSOLE RPR: RPR: NONREACTIVE

## 2017-08-05 ENCOUNTER — Other Ambulatory Visit (HOSPITAL_COMMUNITY): Payer: Self-pay | Admitting: Family

## 2017-08-05 DIAGNOSIS — Z3A13 13 weeks gestation of pregnancy: Secondary | ICD-10-CM

## 2017-08-05 DIAGNOSIS — Z369 Encounter for antenatal screening, unspecified: Secondary | ICD-10-CM

## 2017-08-13 DIAGNOSIS — Z3401 Encounter for supervision of normal first pregnancy, first trimester: Secondary | ICD-10-CM | POA: Diagnosis not present

## 2017-08-15 ENCOUNTER — Encounter (HOSPITAL_COMMUNITY): Payer: Self-pay | Admitting: *Deleted

## 2017-08-18 ENCOUNTER — Ambulatory Visit (HOSPITAL_COMMUNITY)
Admission: RE | Admit: 2017-08-18 | Discharge: 2017-08-18 | Disposition: A | Discharge status: Home / Self Care | Payer: 59 | Source: Ambulatory Visit | Attending: Family | Admitting: Family

## 2017-08-18 ENCOUNTER — Ambulatory Visit (HOSPITAL_COMMUNITY): Payer: 59

## 2017-08-21 ENCOUNTER — Ambulatory Visit (HOSPITAL_COMMUNITY): Admission: RE | Admit: 2017-08-21 | Payer: 59 | Source: Ambulatory Visit

## 2017-08-21 ENCOUNTER — Encounter (HOSPITAL_COMMUNITY): Payer: Self-pay

## 2017-08-21 ENCOUNTER — Other Ambulatory Visit (HOSPITAL_COMMUNITY): Payer: Self-pay | Admitting: Family

## 2017-08-21 ENCOUNTER — Ambulatory Visit (HOSPITAL_COMMUNITY)
Admission: RE | Admit: 2017-08-21 | Discharge: 2017-08-21 | Disposition: A | Discharge status: Home / Self Care | Payer: 59 | Source: Ambulatory Visit | Attending: Family | Admitting: Family

## 2017-08-21 DIAGNOSIS — Z3687 Encounter for antenatal screening for uncertain dates: Secondary | ICD-10-CM

## 2017-08-21 DIAGNOSIS — Z3A13 13 weeks gestation of pregnancy: Secondary | ICD-10-CM

## 2017-08-21 DIAGNOSIS — Z369 Encounter for antenatal screening, unspecified: Secondary | ICD-10-CM

## 2017-08-21 DIAGNOSIS — O99211 Obesity complicating pregnancy, first trimester: Secondary | ICD-10-CM | POA: Diagnosis present

## 2017-08-26 ENCOUNTER — Encounter: Payer: 59 | Admitting: Obstetrics and Gynecology

## 2017-09-10 DIAGNOSIS — Z3A16 16 weeks gestation of pregnancy: Secondary | ICD-10-CM | POA: Diagnosis not present

## 2017-09-10 DIAGNOSIS — Z3402 Encounter for supervision of normal first pregnancy, second trimester: Secondary | ICD-10-CM | POA: Diagnosis not present

## 2017-10-08 DIAGNOSIS — Z3402 Encounter for supervision of normal first pregnancy, second trimester: Secondary | ICD-10-CM | POA: Diagnosis not present

## 2017-11-05 DIAGNOSIS — Z3402 Encounter for supervision of normal first pregnancy, second trimester: Secondary | ICD-10-CM | POA: Diagnosis not present

## 2017-11-17 ENCOUNTER — Encounter: Payer: 59 | Admitting: Family Medicine

## 2017-11-19 DIAGNOSIS — Z3402 Encounter for supervision of normal first pregnancy, second trimester: Secondary | ICD-10-CM | POA: Diagnosis not present

## 2017-11-23 NOTE — Progress Notes (Signed)
HPI:   [name] is a 27 y.o. female, who is here today for her routine physical.  Last CPE: 11/15/16  Regular exercise 3 or more time per week: 2 times per week she is walking. Following a healthy diet: "trying to." She lives with her boyfriend.  Chronic medical problems: Insomnia,dysmenorrhea,obesity.  Pap smear 10/2016 Negative. CT,GC,and trich negative.  M:12 G:1   P:0 Positive pregnancy test in 06/2017.  She is following with Ob, 27 weeks pregnancy. She is not longer having nausea. She has gained about 6618 Lb,goal is to gain about 25 Lb total during pregnancy.   Hx of STD's : Denies.   There is no immunization history on file for this patient.  She has no concerns today.   Review of Systems  Constitutional: Negative for appetite change, fatigue and fever.  HENT: Negative for dental problem, hearing loss, mouth sores, sore throat, trouble swallowing and voice change.   Eyes: Negative for redness and visual disturbance.  Respiratory: Negative for cough, shortness of breath and wheezing.   Cardiovascular: Negative for chest pain, palpitations and leg swelling.  Gastrointestinal: Negative for abdominal pain, nausea and vomiting.       No changes in bowel habits.  Endocrine: Negative for cold intolerance, heat intolerance, polydipsia, polyphagia and polyuria.  Genitourinary: Negative for decreased urine volume, dysuria, hematuria, vaginal bleeding and vaginal discharge.  Musculoskeletal: Negative for gait problem and myalgias.  Skin: Negative for color change and rash.  Allergic/Immunologic: Negative for environmental allergies.  Neurological: Negative for syncope, weakness and headaches.  Hematological: Negative for adenopathy. Does not bruise/bleed easily.  Psychiatric/Behavioral: Negative for confusion and sleep disturbance. The patient is not nervous/anxious.   All other systems reviewed and are negative.     Current Outpatient Medications on  File Prior to Visit  Medication Sig Dispense Refill  . Prenatal Multivit-Min-Fe-FA (PRENATAL VITAMINS PO) Take by mouth.     No current facility-administered medications on file prior to visit.      Past Medical History:  Diagnosis Date  . Dysmenorrhea   . History of frequent urinary tract infections     Past Surgical History:  Procedure Laterality Date  . NO PAST SURGERIES      No Known Allergies  Family History  Problem Relation Age of Onset  . Hypertension Paternal Uncle   . Diabetes Neg Hx   . Cancer Neg Hx     Social History   Socioeconomic History  . Marital status: Single    Spouse name: Not on file  . Number of children: Not on file  . Years of education: Not on file  . Highest education level: Not on file  Occupational History  . Not on file  Social Needs  . Financial resource strain: Not on file  . Food insecurity:    Worry: Not on file    Inability: Not on file  . Transportation needs:    Medical: Not on file    Non-medical: Not on file  Tobacco Use  . Smoking status: Never Smoker  . Smokeless tobacco: Never Used  Substance and Sexual Activity  . Alcohol use: Not Currently  . Drug use: No  . Sexual activity: Yes    Birth control/protection: None  Lifestyle  . Physical activity:    Days per week: Not on file    Minutes per session: Not on file  . Stress: Not on file  Relationships  . Social connections:    Talks on  phone: Not on file    Gets together: Not on file    Attends religious service: Not on file    Active member of club or organization: Not on file    Attends meetings of clubs or organizations: Not on file    Relationship status: Not on file  Other Topics Concern  . Not on file  Social History Narrative  . Not on file     Vitals:   11/24/17 0806  BP: 108/80  Pulse: 98  Resp: 16  Temp: 97.8 F (36.6 C)  SpO2: 98%   Body mass index is 33.3 kg/m.   Wt Readings from Last 3 Encounters:  11/24/17 219 lb (99.3 kg)    08/21/17 202 lb 2 oz (91.7 kg)  07/07/17 201 lb 8 oz (91.4 kg)    Physical Exam  Nursing note and vitals reviewed. Constitutional: She is oriented to person, place, and time. She appears well-developed. No distress.  HENT:  Head: Normocephalic and atraumatic.  Right Ear: Hearing, tympanic membrane, external ear and ear canal normal.  Left Ear: Hearing, tympanic membrane, external ear and ear canal normal.  Mouth/Throat: Uvula is midline, oropharynx is clear and moist and mucous membranes are normal.  Eyes: Pupils are equal, round, and reactive to light. Conjunctivae and EOM are normal.  Iris hyperpigmentation ,left eye. See picture.  Neck: No tracheal deviation present. No thyromegaly present.  Cardiovascular: Normal rate and regular rhythm.  No murmur heard. Pulses:      Dorsalis pedis pulses are 2+ on the right side, and 2+ on the left side.  Respiratory: Effort normal and breath sounds normal. No respiratory distress.  GI: Soft. She exhibits no mass. There is no hepatomegaly. There is no tenderness.  Gravid.  Genitourinary:  Genitourinary Comments: Deferred to gyn.  Musculoskeletal: She exhibits no edema.  No major deformity or signs of synovitis appreciated.  Lymphadenopathy:    She has no cervical adenopathy.       Right: No supraclavicular adenopathy present.       Left: No supraclavicular adenopathy present.  Neurological: She is alert and oriented to person, place, and time. She has normal strength. No cranial nerve deficit. Coordination and gait normal.  Reflex Scores:      Bicep reflexes are 2+ on the right side and 2+ on the left side.      Patellar reflexes are 2+ on the right side and 2+ on the left side. Skin: Skin is warm. No rash noted. No erythema.  Psychiatric: She has a normal mood and affect.  Well groomed, poor eye contact.        ASSESSMENT AND PLAN:  Ms. Tracie Knight was here today annual physical examination.  No orders of the defined  types were placed in this encounter.   Routine general medical examination at a health care facility  We discussed the importance of regular physical activity and healthy diet for prevention of chronic illness and/or complications. Preventive guidelines reviewed. Vaccination: She is not sure about Tdap. She has appt with her ob next week.She will address vaccination.   Next CPE in a year.   [redacted] weeks gestation of pregnancy  Reporting no complications so far. She will continue following with her ob.    Return in about 1 year (around 11/25/2018) for 1-2 years CPE.     Nidia Grogan G. [place], MD  [facility]. [place] office.

## 2017-11-24 ENCOUNTER — Encounter: Payer: Self-pay | Admitting: Family Medicine

## 2017-11-24 ENCOUNTER — Ambulatory Visit (INDEPENDENT_AMBULATORY_CARE_PROVIDER_SITE_OTHER): Payer: 59 | Admitting: Family Medicine

## 2017-11-24 VITALS — BP 108/80 | HR 98 | Temp 97.8°F | Resp 16 | Ht 68.0 in | Wt 219.0 lb

## 2017-11-24 DIAGNOSIS — Z3A27 27 weeks gestation of pregnancy: Secondary | ICD-10-CM | POA: Diagnosis not present

## 2017-11-24 DIAGNOSIS — Z0000 Encounter for general adult medical examination without abnormal findings: Secondary | ICD-10-CM | POA: Diagnosis not present

## 2017-11-24 NOTE — Patient Instructions (Signed)
A few things to remember from today's visit:   No diagnosis found.  Today you have you routine preventive visit.  At least 150 minutes of moderate exercise per week, daily brisk walking for 15-30 min is a good exercise option. Healthy diet low in saturated (animal) fats and sweets and consisting of fresh fruits and vegetables, lean meats such as fish and white chicken and whole grains.  These are some of recommendations for screening depending of age and risk factors:   - Vaccines:  Tdap vaccine every 10 years.  Shingles vaccine recommended at age 60, could be given after 29 years of age but not sure about insurance coverage.   Pneumonia vaccines:  Prevnar 13 at 65 and Pneumovax at 66. Sometimes Pneumovax is giving earlier if history of smoking, lung disease,diabetes,kidney disease among some.  Screening for diabetes at age 40 and every 3 years.  Cervical cancer prevention:  Pap smear starts at 29 years of age and continues periodically until 29 years old in low risk women. Pap smear every 3 years between 21 and 29 years old. Pap smear every 3-5 years between women 30 and older if pap smear negative and HPV screening negative.   -Breast cancer: Mammogram: There is disagreement between experts about when to start screening in low risk asymptomatic female but recent recommendations are to start screening at 40 and not later than 29 years old , every 1-2 years and after 29 yo q 2 years. Screening is recommended until 29 years old but some women can continue screening depending of healthy issues.   Colon cancer screening: starts at 29 years old until 29 years old.  Cholesterol disorder screening at age 45 and every 3 years.  Also recommended:  1. Dental visit- Brush and floss your teeth twice daily; visit your dentist twice a year. 2. Eye doctor- Get an eye exam at least every 2 years. 3. Helmet use- Always wear a helmet when riding a bicycle, motorcycle, rollerblading or  skateboarding. 4. Safe sex- If you may be exposed to sexually transmitted infections, use a condom. 5. Seat belts- Seat belts can save your live; always wear one. 6. Smoke/Carbon Monoxide detectors- These detectors need to be installed on the appropriate level of your home. Replace batteries at least once a year. 7. Skin cancer- When out in the sun please cover up and use sunscreen 15 SPF or higher. 8. Violence- If anyone is threatening or hurting you, please tell your healthcare provider.  9. Drink alcohol in moderation- Limit alcohol intake to one drink or less per day. Never drink and drive.  Please be sure medication list is accurate. If a new problem present, please set up appointment sooner than planned today.         

## 2017-12-03 DIAGNOSIS — O99019 Anemia complicating pregnancy, unspecified trimester: Secondary | ICD-10-CM | POA: Diagnosis not present

## 2017-12-03 DIAGNOSIS — Z3403 Encounter for supervision of normal first pregnancy, third trimester: Secondary | ICD-10-CM | POA: Diagnosis not present

## 2017-12-03 DIAGNOSIS — O99810 Abnormal glucose complicating pregnancy: Secondary | ICD-10-CM | POA: Diagnosis not present

## 2017-12-10 DIAGNOSIS — O99810 Abnormal glucose complicating pregnancy: Secondary | ICD-10-CM | POA: Diagnosis not present

## 2017-12-17 DIAGNOSIS — R030 Elevated blood-pressure reading, without diagnosis of hypertension: Secondary | ICD-10-CM | POA: Diagnosis not present

## 2017-12-17 DIAGNOSIS — Z3403 Encounter for supervision of normal first pregnancy, third trimester: Secondary | ICD-10-CM | POA: Diagnosis not present

## 2017-12-19 DIAGNOSIS — Z3403 Encounter for supervision of normal first pregnancy, third trimester: Secondary | ICD-10-CM | POA: Diagnosis not present

## 2017-12-31 DIAGNOSIS — Z3403 Encounter for supervision of normal first pregnancy, third trimester: Secondary | ICD-10-CM | POA: Diagnosis not present

## 2018-01-14 DIAGNOSIS — Z3403 Encounter for supervision of normal first pregnancy, third trimester: Secondary | ICD-10-CM | POA: Diagnosis not present

## 2018-01-28 DIAGNOSIS — Z3403 Encounter for supervision of normal first pregnancy, third trimester: Secondary | ICD-10-CM | POA: Diagnosis not present

## 2018-01-28 DIAGNOSIS — Z23 Encounter for immunization: Secondary | ICD-10-CM | POA: Diagnosis not present

## 2018-01-28 LAB — OB RESULTS CONSOLE GBS: STREP GROUP B AG: NEGATIVE

## 2018-02-04 ENCOUNTER — Other Ambulatory Visit: Payer: Self-pay

## 2018-02-04 ENCOUNTER — Inpatient Hospital Stay (HOSPITAL_COMMUNITY)
Admission: AD | Admit: 2018-02-04 | Discharge: 2018-02-07 | DRG: 807 | Disposition: A | Discharge status: Home / Self Care | Payer: 59 | Attending: Obstetrics & Gynecology | Admitting: Obstetrics & Gynecology

## 2018-02-04 ENCOUNTER — Encounter (HOSPITAL_COMMUNITY): Payer: Self-pay | Admitting: *Deleted

## 2018-02-04 DIAGNOSIS — O99324 Drug use complicating childbirth: Secondary | ICD-10-CM | POA: Diagnosis present

## 2018-02-04 DIAGNOSIS — F1290 Cannabis use, unspecified, uncomplicated: Secondary | ICD-10-CM | POA: Diagnosis present

## 2018-02-04 DIAGNOSIS — O9902 Anemia complicating childbirth: Secondary | ICD-10-CM | POA: Diagnosis present

## 2018-02-04 DIAGNOSIS — E669 Obesity, unspecified: Secondary | ICD-10-CM | POA: Diagnosis present

## 2018-02-04 DIAGNOSIS — O133 Gestational [pregnancy-induced] hypertension without significant proteinuria, third trimester: Secondary | ICD-10-CM

## 2018-02-04 DIAGNOSIS — D649 Anemia, unspecified: Secondary | ICD-10-CM | POA: Diagnosis present

## 2018-02-04 DIAGNOSIS — Z3A37 37 weeks gestation of pregnancy: Secondary | ICD-10-CM | POA: Diagnosis not present

## 2018-02-04 DIAGNOSIS — O134 Gestational [pregnancy-induced] hypertension without significant proteinuria, complicating childbirth: Secondary | ICD-10-CM | POA: Diagnosis not present

## 2018-02-04 DIAGNOSIS — Z6830 Body mass index (BMI) 30.0-30.9, adult: Secondary | ICD-10-CM

## 2018-02-04 DIAGNOSIS — O99214 Obesity complicating childbirth: Secondary | ICD-10-CM | POA: Diagnosis present

## 2018-02-04 DIAGNOSIS — R030 Elevated blood-pressure reading, without diagnosis of hypertension: Secondary | ICD-10-CM | POA: Diagnosis not present

## 2018-02-04 DIAGNOSIS — Z3403 Encounter for supervision of normal first pregnancy, third trimester: Secondary | ICD-10-CM | POA: Diagnosis not present

## 2018-02-04 LAB — ABO/RH: ABO/RH(D): O POS

## 2018-02-04 LAB — CBC
HCT: 38.2 % (ref 36.0–46.0)
HEMOGLOBIN: 12.5 g/dL (ref 12.0–15.0)
MCH: 23.5 pg — ABNORMAL LOW (ref 26.0–34.0)
MCHC: 32.7 g/dL (ref 30.0–36.0)
MCV: 71.7 fL — AB (ref 80.0–100.0)
NRBC: 0 % (ref 0.0–0.2)
Platelets: 173 10*3/uL (ref 150–400)
RBC: 5.33 MIL/uL — AB (ref 3.87–5.11)
RDW: 15.8 % — ABNORMAL HIGH (ref 11.5–15.5)
WBC: 7 10*3/uL (ref 4.0–10.5)

## 2018-02-04 LAB — COMPREHENSIVE METABOLIC PANEL
ALK PHOS: 104 U/L (ref 38–126)
ALT: 26 U/L (ref 0–44)
AST: 29 U/L (ref 15–41)
Albumin: 3.3 g/dL — ABNORMAL LOW (ref 3.5–5.0)
Anion gap: 11 (ref 5–15)
BUN: 7 mg/dL (ref 6–20)
CO2: 20 mmol/L — ABNORMAL LOW (ref 22–32)
CREATININE: 0.54 mg/dL (ref 0.44–1.00)
Calcium: 9.3 mg/dL (ref 8.9–10.3)
Chloride: 104 mmol/L (ref 98–111)
GFR calc Af Amer: >60 mL/min (ref >60)
GFR calc non Af Amer: >60 mL/min (ref >60)
Glucose, Bld: 99 mg/dL (ref 70–99)
Potassium: 3.5 mmol/L (ref 3.5–5.1)
SODIUM: 135 mmol/L (ref 135–145)
Total Bilirubin: 0.5 mg/dL (ref 0.3–1.2)
Total Protein: 6.8 g/dL (ref 6.5–8.1)

## 2018-02-04 LAB — PROTEIN / CREATININE RATIO, URINE
Creatinine, Urine: 200 mg/dL
Protein Creatinine Ratio: 0.12 mg/mg{Cre} (ref 0.00–0.15)
Total Protein, Urine: 24 mg/dL

## 2018-02-04 LAB — TYPE AND SCREEN
ABO/RH(D): O POS
Antibody Screen: NEGATIVE

## 2018-02-04 MED ORDER — SOD CITRATE-CITRIC ACID 500-334 MG/5ML PO SOLN: 30.0000 mL | ORAL | Status: DC | PRN

## 2018-02-04 MED ORDER — OXYCODONE-ACETAMINOPHEN 5-325 MG PO TABS: 2.0000 | ORAL_TABLET | ORAL | Status: DC | PRN

## 2018-02-04 MED ORDER — LACTATED RINGERS IV SOLN
INTRAVENOUS | Status: DC
  Administered 2018-02-04 – 2018-02-05 (×2): via INTRAVENOUS

## 2018-02-04 MED ORDER — OXYTOCIN 40 UNITS IN LACTATED RINGERS INFUSION - SIMPLE MED
2.5000 [IU]/h | INTRAVENOUS | Status: DC
  Administered 2018-02-05: 2.5 [IU]/h via INTRAVENOUS

## 2018-02-04 MED ORDER — TERBUTALINE SULFATE 1 MG/ML IJ SOLN
0.2500 mg | Freq: Once | INTRAMUSCULAR | Status: DC | PRN
  Filled 2018-02-04: qty 1

## 2018-02-04 MED ORDER — ACETAMINOPHEN 325 MG PO TABS: 650.0000 mg | ORAL_TABLET | ORAL | Status: DC | PRN

## 2018-02-04 MED ORDER — LIDOCAINE HCL (PF) 1 % IJ SOLN
30.0000 mL | INTRAMUSCULAR | Status: DC | PRN
  Filled 2018-02-04: qty 30

## 2018-02-04 MED ORDER — OXYTOCIN 40 UNITS IN LACTATED RINGERS INFUSION - SIMPLE MED
1.0000 m[IU]/min | INTRAVENOUS | Status: DC
  Administered 2018-02-04: 2 m[IU]/min via INTRAVENOUS
  Administered 2018-02-05: 14 m[IU]/min via INTRAVENOUS
  Administered 2018-02-05: 18 m[IU]/min via INTRAVENOUS
  Administered 2018-02-05: 16 m[IU]/min via INTRAVENOUS
  Filled 2018-02-04: qty 1000

## 2018-02-04 MED ORDER — FLEET ENEMA 7-19 GM/118ML RE ENEM: 1.0000 | ENEMA | RECTAL | Status: DC | PRN

## 2018-02-04 MED ORDER — OXYCODONE-ACETAMINOPHEN 5-325 MG PO TABS: 1.0000 | ORAL_TABLET | ORAL | Status: DC | PRN

## 2018-02-04 MED ORDER — MISOPROSTOL 50MCG HALF TABLET
50.0000 ug | ORAL_TABLET | ORAL | Status: DC
  Administered 2018-02-04: 50 ug via BUCCAL
  Filled 2018-02-04 (×7): qty 1

## 2018-02-04 MED ORDER — TERBUTALINE SULFATE 1 MG/ML IJ SOLN: 0.2500 mg | Freq: Once | INTRAMUSCULAR | Status: DC | PRN

## 2018-02-04 MED ORDER — ONDANSETRON HCL 4 MG/2ML IJ SOLN
4.0000 mg | Freq: Four times a day (QID) | INTRAMUSCULAR | Status: DC | PRN
  Filled 2018-02-04: qty 2

## 2018-02-04 MED ORDER — LACTATED RINGERS IV SOLN
500.0000 mL | INTRAVENOUS | Status: DC | PRN
  Administered 2018-02-05: 500 mL via INTRAVENOUS

## 2018-02-04 MED ORDER — OXYTOCIN BOLUS FROM INFUSION
500.0000 mL | Freq: Once | INTRAVENOUS | Status: AC
  Administered 2018-02-05: 500 mL via INTRAVENOUS

## 2018-02-04 NOTE — MAU Provider Note (Signed)
Chief Complaint  Patient presents with  . Hypertension     First Provider Initiated Contact with Patient 02/04/18 1515      S: Tracie Knight  is a 27 y.o. y.o. year old G1P0 female at [redacted]w[redacted]d weeks gestation who presents to MAU with elevated blood pressures. Denies Hx of hypertension. Current blood pressure medication: none.  BPs have been monitored & elevated since 12/17/2017. Elevated today in the office ([facility]) & was sent here for evaluation.   Associated symptoms: Denies Headache, denies vision changes, denies epigastric pain Contractions: denies Vaginal bleeding: denies Fetal movement: normal  O:  Patient Vitals for the past 24 hrs:  BP Temp Temp src Pulse Resp SpO2 Height Weight  02/04/18 1546 (!) 137/100 - - (!) 107 - - - -  02/04/18 1516 (!) 135/93 - - (!) 105 - - - -  02/04/18 1500 (!) 132/92 - - (!) 102 - - - -  02/04/18 1443 (!) 130/97 - - (!) 109 - - - -  02/04/18 1437 127/87 98.8 F (37.1 C) Oral (!) 113 16 100 % 5\' 7"  (1.702 m) 103.9 kg   General: NAD Heart: Regular rate Lungs: Normal rate and effort Abd: Soft, NT, Gravid, S=D Extremities: No pitting Pedal edema Neuro: 2+ deep tendon reflexes, No clonus  Dilation: 1 Effacement (%): 70 Station: Ballotable Presentation: Vertex Exam by:: Doreatha Massed, RNC  NST:  Baseline: 125 bpm, Variability: Good {> 6 bpm), Accelerations: Reactive and Decelerations: Absent  Results for orders placed or performed during the hospital encounter of 02/04/18 (from the past 24 hour(s))  CBC     Status: Abnormal   Collection Time: 02/04/18  3:35 PM  Result Value Ref Range   WBC 7.0 4.0 - 10.5 K/uL   RBC 5.33 (H) 3.87 - 5.11 MIL/uL   Hemoglobin 12.5 12.0 - 15.0 g/dL   HCT 95.6 21.3 - 08.6 %   MCV 71.7 (L) 80.0 - 100.0 fL   MCH 23.5 (L) 26.0 - 34.0 pg   MCHC 32.7 30.0 - 36.0 g/dL   RDW 57.8 (H) 46.9 - 62.9 %   Platelets 173 150 - 400 K/uL   nRBC 0.0 0.0 - 0.2 %    A: [redacted]w[redacted]d week IUP Gestational hypertension FHR  reactive  P:  Admit to birthing suites per consult with Dr. Ashok Pall.   Judeth Horn, NP 02/04/2018 4:03 PM

## 2018-02-04 NOTE — MAU Note (Signed)
Urine sent to lab 

## 2018-02-04 NOTE — MAU Note (Signed)
Seen at [facility] today, elevated b/p, denies headache, denies complaints

## 2018-02-04 NOTE — Progress Notes (Signed)
LABOR PROGRESS NOTE  Tracie Knight is a 27 y.o. G1P0 at [redacted]w[redacted]d  admitted for IOL 2/2 GHTN.   Subjective: Doing well, bouncing on ball.   Objective: BP (!) 139/92   Pulse (!) 110   Temp 98.1 F (36.7 C) (Oral)   Resp 16   Ht 5\' 7"  (1.702 m)   Wt 104.3 kg   LMP 05/16/2017 (Exact Date)   SpO2 100%   BMI 36.02 kg/m  or  Vitals:   02/04/18 1738 02/04/18 1913 02/04/18 2013 02/04/18 2123  BP: 138/79 140/90 (!) 126/93 (!) 139/92  Pulse: 90 96 (!) 102 (!) 110  Resp: 15 16 16 16   Temp:      TempSrc:      SpO2:      Weight:      Height:        Dilation: 4 Effacement (%): 50 Station: -3, -2 Presentation: Vertex Exam by:: Dr. Annia Friendly Toco: irregular   Labs: Lab Results  Component Value Date   WBC 7.0 02/04/2018   HGB 12.5 02/04/2018   HCT 38.2 02/04/2018   MCV 71.7 (L) 02/04/2018   PLT 173 02/04/2018    Patient Active Problem List   Diagnosis Date Noted  . Gestational hypertension, third trimester 02/04/2018  . Class 1 obesity with body mass index (BMI) of 30.0 to 30.9 in adult 08/15/2016  . Dysmenorrhea 08/15/2016  . Corneal pigmentation of left eye 08/15/2016    Assessment / Plan: 27 y.o. G1P0 at [redacted]w[redacted]d here for IOL due to [facility].   Labor: IOL, FB out around 2100. Will start pit 2x2.  Fetal Wellbeing:  Cat 1 strip  Pain Control:  Under control currently, IV fent PRN  Anticipated MOD:  NSVD   1. GHTN: Mild to moderate range. Asymptomatic. HELLP labs and UPC wnl.   -Monitor BP   Leticia Penna, D.O. Family Medicine PGY-1   02/04/2018, 9:30 PM

## 2018-02-04 NOTE — H&P (Signed)
LABOR AND DELIVERY ADMISSION HISTORY AND PHYSICAL NOTE  Nohealani Medinger is a 27 y.o. female G1P0 with IUP at [redacted]w[redacted]d by LMP presenting to MAU today for IOL 2/2 gHTN. [facility] was [facility], where she was being seen today and found to have elevated BP on exam without any associated HA or additional PEC symptoms. Per MAU provider note, her BP has been monitored and elevated since 12/17/17. She has not taken any medications throughout her pregnancy.   She reports positive fetal movement. She denies leakage of fluid or vaginal bleeding.  Prenatal History/Complications: PNC at [facility] Pregnancy complications:  - gHTN - anemia - marijuana use - obesity  Past Medical History: Past Medical History:  Diagnosis Date  . Dysmenorrhea   . History of frequent urinary tract infections     Past Surgical History: Past Surgical History:  Procedure Laterality Date  . NO PAST SURGERIES      Obstetrical History: OB History    Gravida  1   Para      Term      Preterm      AB      Living  0     SAB      TAB      Ectopic      Multiple      Live Births              Social History: Social History   Socioeconomic History  . Marital status: Single    Spouse name: Not on file  . Number of children: Not on file  . Years of education: Not on file  . Highest education level: Not on file  Occupational History  . Not on file  Social Needs  . Financial resource strain: Not on file  . Food insecurity:    Worry: Not on file    Inability: Not on file  . Transportation needs:    Medical: Not on file    Non-medical: Not on file  Tobacco Use  . Smoking status: Never Smoker  . Smokeless tobacco: Never Used  Substance and Sexual Activity  . Alcohol use: Not Currently  . Drug use: No  . Sexual activity: Yes    Birth control/protection: None  Lifestyle  . Physical activity:    Days per week: Not on file    Minutes per session: Not on file  . Stress: Not on file   Relationships  . Social connections:    Talks on phone: Not on file    Gets together: Not on file    Attends religious service: Not on file    Active member of club or organization: Not on file    Attends meetings of clubs or organizations: Not on file    Relationship status: Not on file  Other Topics Concern  . Not on file  Social History Narrative  . Not on file    Family History: Family History  Problem Relation Age of Onset  . Healthy Mother   . Hypertension Paternal Uncle   . Diabetes Neg Hx   . Cancer Neg Hx     Allergies: No Known Allergies  Medications Prior to Admission  Medication Sig Dispense Refill Last Dose  . acetaminophen (TYLENOL) 325 MG tablet Take 650 mg by mouth every 6 (six) hours as needed for moderate pain.   02/02/2018  . Prenatal Multivit-Min-Fe-FA (PRENATAL VITAMINS PO) Take 1 tablet by mouth daily.    02/03/2018 at Unknown time  Review of Systems  All systems reviewed and negative except as stated in HPI  Physical Exam Blood pressure (!) 140/99, pulse 99, temperature 98.1 F (36.7 C), temperature source Oral, resp. rate 15, height 5\' 7"  (1.702 m), weight 104.3 kg, last menstrual period 05/16/2017, SpO2 100 %. General appearance: alert, oriented, NAD Lungs: normal respiratory effort Heart: regular rate Abdomen: soft, non-tender; gravid, FH appropriate for GA Extremities: No calf swelling or tenderness Presentation: cephalic Fetal monitoring: Baseline FHR 125 bpm with moderate variability, +accelerations, no decelerations Uterine activity: irregular w/irritability Dilation: 1 Effacement (%): 70 Station: Ballotable Exam by:: Kem Parkinson CNM  Prenatal labs: ABO, Rh: --/--/O POS (11/06 1627) Antibody: NEG (11/06 1627) Rubella: Immune (05/06 0000) RPR: Nonreactive (05/06 0000)  HBsAg: Negative (05/06 0000)  HIV: Non-reactive (05/06 0000)  GC/Chlamydia: Negative GBS: Negative (10/30 0000)  2-hr GTT: Normal f/u 3-hr GTT 12/10/17 after  initial elevated 1-hr BG 12/03/17 Genetic screening:  Negative Quad Screen 09/10/17 Anatomy US: Normal  Prenatal Transfer Tool  Maternal Diabetes: No Genetic Screening: Normal Maternal Ultrasounds/Referrals: Normal Fetal Ultrasounds or other Referrals:  None Maternal Substance Abuse:  No Significant Maternal Medications:  None Significant Maternal Lab Results: None  Results for orders placed or performed during the hospital encounter of 02/04/18 (from the past 24 hour(s))  Protein / creatinine ratio, urine   Collection Time: 02/04/18  2:38 PM  Result Value Ref Range   Creatinine, Urine 200.00 mg/dL   Total Protein, Urine 24 mg/dL   Protein Creatinine Ratio 0.12 0.00 - 0.15 mg/mg[Cre]  CBC   Collection Time: 02/04/18  3:35 PM  Result Value Ref Range   WBC 7.0 4.0 - 10.5 K/uL   RBC 5.33 (H) 3.87 - 5.11 MIL/uL   Hemoglobin 12.5 12.0 - 15.0 g/dL   HCT 62.9 52.8 - 41.3 %   MCV 71.7 (L) 80.0 - 100.0 fL   MCH 23.5 (L) 26.0 - 34.0 pg   MCHC 32.7 30.0 - 36.0 g/dL   RDW 24.4 (H) 01.0 - 27.2 %   Platelets 173 150 - 400 K/uL   nRBC 0.0 0.0 - 0.2 %  Comprehensive metabolic panel   Collection Time: 02/04/18  3:35 PM  Result Value Ref Range   Sodium 135 135 - 145 mmol/L   Potassium 3.5 3.5 - 5.1 mmol/L   Chloride 104 98 - 111 mmol/L   CO2 20 (L) 22 - 32 mmol/L   Glucose, Bld 99 70 - 99 mg/dL   BUN 7 6 - 20 mg/dL   Creatinine, Ser 5.36 0.44 - 1.00 mg/dL   Calcium 9.3 8.9 - 64.4 mg/dL   Total Protein 6.8 6.5 - 8.1 g/dL   Albumin 3.3 (L) 3.5 - 5.0 g/dL   AST 29 15 - 41 U/L   ALT 26 0 - 44 U/L   Alkaline Phosphatase 104 38 - 126 U/L   Total Bilirubin 0.5 0.3 - 1.2 mg/dL   GFR calc non Af Amer >60 >60 mL/min   GFR calc Af Amer >60 >60 mL/min   Anion gap 11 5 - 15  Type and screen [facility]   Collection Time: 02/04/18  4:27 PM  Result Value Ref Range   ABO/RH(D) O POS    Antibody Screen NEG    Sample Expiration      02/07/2018 Performed at [facility],  [address], [place], [place] [address]     Patient Active Problem List   Diagnosis Date Noted  . Gestational hypertension, third trimester  02/04/2018  . Class 1 obesity with body mass index (BMI) of 30.0 to 30.9 in adult 08/15/2016  . Dysmenorrhea 08/15/2016  . Corneal pigmentation of left eye 08/15/2016    Assessment: Armeda Plumb is a 27 y.o. G1P0 at [redacted]w[redacted]d here for IOL 2/2 gHTN  #Labor:   -- Uterine contractions  -- Baseline FHR 125 bpm with moderate variability, +accelerations, no decerlerations #Pain: IV fentanyl +/- nitrous oxide. Pt wants to try to attempt labor without epidural #ID:  GBS (-) #MOF: Breast #MOC: Nexplanon  #Circ:  Inpatient  Hal Neer 02/04/2018, 6:10 PM  Midwife attestation: I have seen and examined this patient; I agree with above documentation in the student's note.   PE: Gen: calm comfortable, NAD Resp: normal effort and rate Abd: gravid  ROS, labs, PMH reviewed  Assessment/Plan: Reanne Nellums is a 27 y.o. G1P0 here for IOL for gHTN, asymptomatic Admit to LD Labor: latent FWB: Cat I ID: GBS neg Cytotec and FB then Pitocin PEC labs nml  Donette Larry, CNM  02/04/2018, 7:24 PM

## 2018-02-05 ENCOUNTER — Encounter (HOSPITAL_COMMUNITY): Payer: Self-pay

## 2018-02-05 ENCOUNTER — Inpatient Hospital Stay (HOSPITAL_COMMUNITY): Payer: 59 | Admitting: Anesthesiology

## 2018-02-05 DIAGNOSIS — Z3A37 37 weeks gestation of pregnancy: Secondary | ICD-10-CM

## 2018-02-05 DIAGNOSIS — O134 Gestational [pregnancy-induced] hypertension without significant proteinuria, complicating childbirth: Secondary | ICD-10-CM

## 2018-02-05 LAB — CBC WITH DIFFERENTIAL/PLATELET
BASOS PCT: 0 %
Basophils Absolute: 0 10*3/uL (ref 0.0–0.1)
Eosinophils Absolute: 0 10*3/uL (ref 0.0–0.5)
Eosinophils Relative: 0 %
HEMATOCRIT: 38.4 % (ref 36.0–46.0)
HEMOGLOBIN: 12.3 g/dL (ref 12.0–15.0)
Lymphocytes Relative: 18 %
Lymphs Abs: 1.9 10*3/uL (ref 0.7–4.0)
MCH: 22.9 pg — ABNORMAL LOW (ref 26.0–34.0)
MCHC: 32 g/dL (ref 30.0–36.0)
MCV: 71.5 fL — AB (ref 80.0–100.0)
MONO ABS: 0.4 10*3/uL (ref 0.1–1.0)
MONOS PCT: 4 %
NEUTROS ABS: 8.2 10*3/uL — AB (ref 1.7–7.7)
NEUTROS PCT: 78 %
Platelets: 164 10*3/uL (ref 150–400)
RBC: 5.37 MIL/uL — ABNORMAL HIGH (ref 3.87–5.11)
RDW: 15.8 % — AB (ref 11.5–15.5)
WBC: 10.6 10*3/uL — ABNORMAL HIGH (ref 4.0–10.5)
nRBC: 0 % (ref 0.0–0.2)

## 2018-02-05 LAB — RPR: RPR: NONREACTIVE

## 2018-02-05 MED ORDER — PRENATAL MULTIVITAMIN CH
1.0000 | ORAL_TABLET | Freq: Every day | ORAL | Status: DC
  Administered 2018-02-06 – 2018-02-07 (×2): 1 via ORAL
  Filled 2018-02-05 (×2): qty 1

## 2018-02-05 MED ORDER — SENNOSIDES-DOCUSATE SODIUM 8.6-50 MG PO TABS
2.0000 | ORAL_TABLET | ORAL | Status: DC
  Administered 2018-02-05 – 2018-02-06 (×2): 2 via ORAL
  Filled 2018-02-05 (×2): qty 2

## 2018-02-05 MED ORDER — TETANUS-DIPHTH-ACELL PERTUSSIS 5-2.5-18.5 LF-MCG/0.5 IM SUSP: 0.5000 mL | Freq: Once | INTRAMUSCULAR | Status: DC

## 2018-02-05 MED ORDER — WITCH HAZEL-GLYCERIN EX PADS: 1.0000 "application " | MEDICATED_PAD | CUTANEOUS | Status: DC | PRN

## 2018-02-05 MED ORDER — LIDOCAINE HCL (PF) 1 % IJ SOLN
INTRAMUSCULAR | Status: DC | PRN
  Administered 2018-02-05: 10 mL via EPIDURAL

## 2018-02-05 MED ORDER — BENZOCAINE-MENTHOL 20-0.5 % EX AERO: 1.0000 "application " | INHALATION_SPRAY | CUTANEOUS | Status: DC | PRN

## 2018-02-05 MED ORDER — ONDANSETRON HCL 4 MG PO TABS: 4.0000 mg | ORAL_TABLET | ORAL | Status: DC | PRN

## 2018-02-05 MED ORDER — DIPHENHYDRAMINE HCL 25 MG PO CAPS: 25.0000 mg | ORAL_CAPSULE | Freq: Four times a day (QID) | ORAL | Status: DC | PRN

## 2018-02-05 MED ORDER — PHENYLEPHRINE 40 MCG/ML (10ML) SYRINGE FOR IV PUSH (FOR BLOOD PRESSURE SUPPORT)
80.0000 ug | PREFILLED_SYRINGE | INTRAVENOUS | Status: DC | PRN
  Filled 2018-02-05: qty 5

## 2018-02-05 MED ORDER — DIPHENHYDRAMINE HCL 50 MG/ML IJ SOLN: 12.5000 mg | INTRAMUSCULAR | Status: DC | PRN

## 2018-02-05 MED ORDER — SIMETHICONE 80 MG PO CHEW: 80.0000 mg | CHEWABLE_TABLET | ORAL | Status: DC | PRN

## 2018-02-05 MED ORDER — FENTANYL 2.5 MCG/ML BUPIVACAINE 1/10 % EPIDURAL INFUSION (WH - ANES)
14.0000 mL/h | INTRAMUSCULAR | Status: DC | PRN
  Administered 2018-02-05: 14 mL/h via EPIDURAL
  Filled 2018-02-05: qty 100

## 2018-02-05 MED ORDER — ZOLPIDEM TARTRATE 5 MG PO TABS: 5.0000 mg | ORAL_TABLET | Freq: Every evening | ORAL | Status: DC | PRN

## 2018-02-05 MED ORDER — COCONUT OIL OIL: 1.0000 "application " | TOPICAL_OIL | Status: DC | PRN

## 2018-02-05 MED ORDER — PHENYLEPHRINE 40 MCG/ML (10ML) SYRINGE FOR IV PUSH (FOR BLOOD PRESSURE SUPPORT)
80.0000 ug | PREFILLED_SYRINGE | INTRAVENOUS | Status: DC | PRN
  Filled 2018-02-05: qty 5
  Filled 2018-02-05: qty 10

## 2018-02-05 MED ORDER — EPHEDRINE 5 MG/ML INJ
10.0000 mg | INTRAVENOUS | Status: DC | PRN
  Filled 2018-02-05: qty 2

## 2018-02-05 MED ORDER — ONDANSETRON HCL 4 MG/2ML IJ SOLN: 4.0000 mg | INTRAMUSCULAR | Status: DC | PRN

## 2018-02-05 MED ORDER — IBUPROFEN 600 MG PO TABS
600.0000 mg | ORAL_TABLET | Freq: Four times a day (QID) | ORAL | Status: DC
  Administered 2018-02-05 – 2018-02-07 (×8): 600 mg via ORAL
  Filled 2018-02-05 (×8): qty 1

## 2018-02-05 MED ORDER — LACTATED RINGERS IV SOLN
500.0000 mL | Freq: Once | INTRAVENOUS | Status: AC
  Administered 2018-02-05: 500 mL via INTRAVENOUS

## 2018-02-05 MED ORDER — ACETAMINOPHEN 325 MG PO TABS: 650.0000 mg | ORAL_TABLET | ORAL | Status: DC | PRN

## 2018-02-05 MED ORDER — DIBUCAINE 1 % RE OINT: 1.0000 "application " | TOPICAL_OINTMENT | RECTAL | Status: DC | PRN

## 2018-02-05 MED ORDER — FENTANYL CITRATE (PF) 100 MCG/2ML IJ SOLN
100.0000 ug | INTRAMUSCULAR | Status: DC | PRN
  Administered 2018-02-05 (×4): 100 ug via INTRAVENOUS
  Filled 2018-02-05 (×4): qty 2

## 2018-02-05 NOTE — Anesthesia Procedure Notes (Signed)
Epidural Patient location during procedure: OB Start time: 02/05/2018 10:31 AM End time: 02/05/2018 10:41 AM  Staffing Anesthesiologist: Lucretia Kern, MD Performed: anesthesiologist   Preanesthetic Checklist Completed: patient identified, pre-op evaluation, timeout performed, IV checked, risks and benefits discussed and monitors and equipment checked  Epidural Patient position: sitting Prep: DuraPrep Patient monitoring: heart rate, continuous pulse ox and blood pressure Approach: midline Location: L3-L4 Injection technique: LOR air  Needle:  Needle type: Tuohy  Needle gauge: 17 G Needle length: 9 cm Needle insertion depth: 7 cm Catheter type: closed end flexible Catheter size: 19 Gauge Catheter at skin depth: 12 cm  Assessment Events: blood not aspirated, injection not painful, no injection resistance, negative IV test and no paresthesia  Additional Notes Reason for block:procedure for pain

## 2018-02-05 NOTE — Progress Notes (Signed)
LABOR PROGRESS NOTE  Tracie Knight is a 27 y.o. G1P0 at [redacted]w[redacted]d  admitted for IOL due to [facility].   Subjective: Strip Note   Objective: BP (!) 132/92   Pulse 88   Temp 97.9 F (36.6 C) (Oral)   Resp 16   Ht 5\' 7"  (1.702 m)   Wt 104.3 kg   LMP 05/16/2017 (Exact Date)   SpO2 100%   BMI 36.02 kg/m  or  Vitals:   02/05/18 0501 02/05/18 0530 02/05/18 0600 02/05/18 0630  BP: (!) 139/95 (!) 139/94 (!) 144/99 (!) 132/92  Pulse: 95 98 90 88  Resp: 16 16    Temp:      TempSrc:      SpO2:      Weight:      Height:         Dilation: 5 Effacement (%): 60, 70 Station: -2 Presentation: Vertex Exam by:: Hermenia Bers, RN FHT: baseline rate 130, moderate varibility, +acel, -decel Toco: every 1-3 min   Labs: Lab Results  Component Value Date   WBC 7.0 02/04/2018   HGB 12.5 02/04/2018   HCT 38.2 02/04/2018   MCV 71.7 (L) 02/04/2018   PLT 173 02/04/2018    Patient Active Problem List   Diagnosis Date Noted  . Gestational hypertension, third trimester 02/04/2018  . Class 1 obesity with body mass index (BMI) of 30.0 to 30.9 in adult 08/15/2016  . Dysmenorrhea 08/15/2016  . Corneal pigmentation of left eye 08/15/2016    Assessment / Plan: 27 y.o. G1P0 at [redacted]w[redacted]d here for IOL due to [facility].   Labor: s/p cyto and FB. Continued induction with pit 2x2, at 12. Consideration for AROM on next evaluation if appropriate.  Fetal Wellbeing:  Cat 1 strip Pain Control:  IV fent PRN  Anticipated MOD:  NSVD  1. GHTN: Stable, mild to moderate range. Last BP 139/94. Asymptomatic.   -Monitor BP   Leticia Penna, D.O. Family Medicine PGY-1  02/05/2018, 6:39 AM

## 2018-02-05 NOTE — Anesthesia Preprocedure Evaluation (Addendum)

## 2018-02-05 NOTE — Progress Notes (Signed)
LABOR PROGRESS NOTE  Mar Zettler is a 27 y.o. G1P0 at [redacted]w[redacted]d  admitted for IOL due to [facility].   Subjective: Currently sleeping. Has not needed any pain medication.   Objective: BP (!) 145/102   Pulse 96   Temp 98.1 F (36.7 C) (Oral)   Resp 16   Ht 5\' 7"  (1.702 m)   Wt 104.3 kg   LMP 05/16/2017 (Exact Date)   SpO2 100%   BMI 36.02 kg/m  or  Vitals:   02/04/18 2202 02/04/18 2340 02/05/18 0043 02/05/18 0131  BP: (!) 141/83 (!) 148/100 (!) 149/104 (!) 145/102  Pulse: (!) 114 (!) 106 100 96  Resp: 16 16 16 16   Temp:      TempSrc:      SpO2:      Weight:      Height:        Dilation: 4 Effacement (%): 50 Station: -3, -2 Presentation: Vertex Exam by:: Dr. Annia Friendly FHT: baseline rate 130, moderate varibility, +acel, -decel Toco: every 2-3 min   Labs: Lab Results  Component Value Date   WBC 7.0 02/04/2018   HGB 12.5 02/04/2018   HCT 38.2 02/04/2018   MCV 71.7 (L) 02/04/2018   PLT 173 02/04/2018    Patient Active Problem List   Diagnosis Date Noted  . Gestational hypertension, third trimester 02/04/2018  . Class 1 obesity with body mass index (BMI) of 30.0 to 30.9 in adult 08/15/2016  . Dysmenorrhea 08/15/2016  . Corneal pigmentation of left eye 08/15/2016    Assessment / Plan: 27 y.o. G1P0 at [redacted]w[redacted]d here for IOL due to [facility].   Labor: IOL with pit currently, at 12. Serial cervical exams.  Fetal Wellbeing:  Cat 1 strip Pain Control:  IV fent if needed Anticipated MOD:  NSVD   1. GHTN: Stable, mild-moderate range. Last BP 145/102. Asymptomatic.   Leticia Penna, D.O. Family Medicine PGY-1 02/05/2018, 2:00 AM

## 2018-02-05 NOTE — Progress Notes (Signed)
   Tracie Knight is a 27 y.o. G1P0 at [redacted]w[redacted]d  admitted for induction of labor due to Hypertension.  Subjective:  Patient s/p epidural; has felt some relief but still in pain, continue with epidural bolusing.  No s/s of pre-e.  Objective: Vitals:   02/05/18 1100 02/05/18 1105 02/05/18 1110 02/05/18 1115  BP: (!) 144/83 127/78 (!) 142/86 127/78  Pulse: 91 85 86 84  Resp:      Temp:      TempSrc:      SpO2:      Weight:      Height:       No intake/output data recorded.  FHT:  FHR: 130 bpm, variability: moderate,  accelerations:  Present,  decelerations:  Present occasional early decel and variables but overall moderate variability and accelerations UC:   irregular, every 2-3 minutes SVE:   Dilation: 8 Effacement (%): 90 Station: -1, 0 Exam by:: katherine g jones RN  Pitocin @ 18 mu/min  Labs: Lab Results  Component Value Date   WBC 10.6 (H) 02/05/2018   HGB 12.3 02/05/2018   HCT 38.4 02/05/2018   MCV 71.5 (L) 02/05/2018   PLT 164 02/05/2018    Assessment / Plan: Induction of labor due to gestational hypertension,  progressing well on pitocin  Labor: Progressing normally Fetal Wellbeing:  Category I Pain Control:  Epidural Anticipated MOD:  NSVD  Charlesetta Garibaldi Tamas Suen 02/05/2018, 11:26 AM

## 2018-02-06 MED ORDER — AMLODIPINE BESYLATE 5 MG PO TABS
5.0000 mg | ORAL_TABLET | Freq: Every day | ORAL | Status: DC
  Administered 2018-02-06 – 2018-02-07 (×2): 5 mg via ORAL
  Filled 2018-02-06 (×3): qty 1

## 2018-02-06 NOTE — Progress Notes (Signed)
CSW received consult for hx of marijuana use.  Referral was screened out due to the following: °~MOB had no documented substance use after initial prenatal visit/+UPT. °~MOB had no positive drug screens after initial prenatal visit/+UPT. °~Baby's UDS is negative. ° °Please consult CSW if current concerns arise or by MOB's request. ° °CSW will monitor CDS results and make report to Child Protective Services if warranted. ° °Tyrail Grandfield Boyd-Gilyard, MSW, LCSW °Clinical Social Work °[phone] ° °

## 2018-02-06 NOTE — Anesthesia Postprocedure Evaluation (Signed)
Anesthesia Post Note  Patient: Tracie Knight  Procedure(s) Performed: AN AD HOC LABOR EPIDURAL     Patient location during evaluation: Mother Baby Anesthesia Type: Epidural Level of consciousness: awake and alert Pain management: pain level controlled Vital Signs Assessment: post-procedure vital signs reviewed and stable Respiratory status: spontaneous breathing, nonlabored ventilation and respiratory function stable Cardiovascular status: stable Postop Assessment: no headache, no backache, epidural receding, able to ambulate, adequate PO intake, no apparent nausea or vomiting and patient able to bend at knees Anesthetic complications: no    Last Vitals:  Vitals:   02/05/18 2115 02/06/18 0510  BP: 124/87 (!) 130/92  Pulse: (!) 108 91  Resp: 18 20  Temp: 36.7 C 36.6 C  SpO2: 100%     Last Pain:  Vitals:   02/06/18 0815  TempSrc:   PainSc: 0-No pain   Pain Goal:                 [place] [employer]

## 2018-02-06 NOTE — Lactation Note (Signed)
This note was copied from a baby's chart. Lactation Consultation Note  Patient Name: Tracie Knight [id number] Date: 02/06/2018 Reason for consult: Initial assessment;Early term 37-38.6wks;1st time breastfeeding;Primapara  P1 mother whose infant is now 49 hours old  RN had requested latch assistance.  When I arrived baby had just finished feeding and fed for approximately 15 minutes.  He was calm, relaxed and not showing feeding cues.  Encouraged to feed 8-12 times/24 hours or sooner if baby shows feeding cues.  Mother was familiar with feeding cues.  Also encouraged hand expression before/after feedings.  Colostrum container provided for any EBM she obtains with hand expression.  Mother will return to work in 6 weeks and already has a DEBP for home use.  Mother will call for latch assistance as needed.    Maternal Data Formula Feeding for Exclusion: No Has patient been taught Hand Expression?: Yes Does the patient have breastfeeding experience prior to this delivery?: No  Feeding Feeding Type: Breast Fed  LATCH Score Latch: Grasps breast easily, tongue down, lips flanged, rhythmical sucking.  Audible Swallowing: Spontaneous and intermittent  Type of Nipple: Everted at rest and after stimulation  Comfort (Breast/Nipple): Soft / non-tender  Hold (Positioning): Assistance needed to correctly position infant at breast and maintain latch.  LATCH Score: 9  Interventions    Lactation Tools Discussed/Used     Consult Status Consult Status: Follow-up Date: 02/07/18 Follow-up type: In-patient    Abbee Cremeens R Orlie Cundari 02/06/2018, 12:16 PM

## 2018-02-07 MED ORDER — IBUPROFEN 600 MG PO TABS: 600.0000 mg | ORAL_TABLET | Freq: Four times a day (QID) | ORAL | 0 refills | Status: AC

## 2018-02-07 MED ORDER — AMLODIPINE BESYLATE 5 MG PO TABS: 5.0000 mg | ORAL_TABLET | Freq: Every day | ORAL | 0 refills | Status: AC

## 2018-02-07 NOTE — Lactation Note (Signed)
This note was copied from a baby's chart. Lactation Consultation Note; Mom reports baby just finished feeding about 30 min ago. Fed for 15 min. Reports no pain with latch. Has Medela pump for home. No questions at present. Reviewed our phone number, OP appointments and BFSG as resources for support after DC. To call prn  Patient Name: Tracie Knight [id number] Date: 02/07/2018 Reason for consult: Follow-up assessment   Maternal Data Formula Feeding for Exclusion: No Has patient been taught Hand Expression?: Yes Does the patient have breastfeeding experience prior to this delivery?: No  Feeding Feeding Type: Breast Fed  LATCH Score                   Interventions    Lactation Tools Discussed/Used     Consult Status Consult Status: Complete    Pamelia Hoit 02/07/2018, 10:16 AM

## 2018-02-07 NOTE — Discharge Summary (Signed)
Obstetrics Discharge Summary OB/GYN Faculty Practice   Patient Name: Tracie Knight DOB: 02/04/1991 MRN: [id number]  Date of admission: 02/04/2018 Delivering MD: Marylene Land   Date of discharge: 02/07/2018  Admitting diagnosis: 37.5 STRESS EVAL Intrauterine pregnancy: [redacted]w[redacted]d     Secondary diagnosis:   Principal Problem:   Gestational hypertension, third trimester Active Problems:   Class 1 obesity with body mass index (BMI) of 30.0 to 30.9 in adult  Additional problems:  . MJ use . Anemia in pregnancy     Discharge diagnosis: Term Pregnancy Delivered                                            Postpartum procedures: None  Complications: none  Hospital course: Tracie Knight is a 27 y.o. [redacted]w[redacted]d who was admitted for IOL for gestational hypertension. Her pregnancy was complicated by obesity. Her labor course was notable for induction with cytotec and foley bulb followed by pitocin. She had SROM for clear fluids. Delivery was complicated by 2nd degree perineal laceration repaired. Please see delivery/op note for additional details. Her postpartum course was notable for persistent moderate range blood pressures, and she was started on amlodipine PPD#1. She was breastfeeding without difficulty. By day of discharge, she was passing flatus, urinating, eating and drinking without difficulty. Her pain was well-controlled, and she was discharged home with ibuprofen. She will follow-up in clinic in 4-6 weeks with a blood pressure check in the first week.   Physical exam  Vitals:   02/06/18 1100 02/06/18 1454 02/06/18 2313 02/07/18 0531  BP: 116/77 123/78 124/80 (!) 121/92  Pulse: 93 96 96 92  Resp:  18 18 18   Temp:  98 F (36.7 C) 97.9 F (36.6 C) 97.9 F (36.6 C)  TempSrc:  Oral Oral Oral  SpO2:  100%    Weight:      Height:       General: well-appearing, NAD Lochia: appropriate Uterine Fundus: firm Incision: N/A DVT Evaluation: No significant calf/ankle  edema. Labs: Lab Results  Component Value Date   WBC 10.6 (H) 02/05/2018   HGB 12.3 02/05/2018   HCT 38.4 02/05/2018   MCV 71.5 (L) 02/05/2018   PLT 164 02/05/2018   CMP Latest Ref Rng & Units 02/04/2018  Glucose 70 - 99 mg/dL 99  BUN 6 - 20 mg/dL 7  Creatinine 4.09 - 8.11 mg/dL 9.14  Sodium 782 - 956 mmol/L 135  Potassium 3.5 - 5.1 mmol/L 3.5  Chloride 98 - 111 mmol/L 104  CO2 22 - 32 mmol/L 20(L)  Calcium 8.9 - 10.3 mg/dL 9.3  Total Protein 6.5 - 8.1 g/dL 6.8  Total Bilirubin 0.3 - 1.2 mg/dL 0.5  Alkaline Phos 38 - 126 U/L 104  AST 15 - 41 U/L 29  ALT 0 - 44 U/L 26    Discharge instructions: Per After Visit Summary and "Baby and Me Booklet"  After visit meds:  Allergies as of 02/07/2018   No Known Allergies     Medication List    TAKE these medications   acetaminophen 325 MG tablet Commonly known as:  TYLENOL Take 650 mg by mouth every 6 (six) hours as needed for moderate pain.   amLODipine 5 MG tablet Commonly known as:  NORVASC Take 1 tablet (5 mg total) by mouth daily.   ibuprofen 600 MG tablet Commonly known as:  ADVIL,MOTRIN Take 1  tablet (600 mg total) by mouth every 6 (six) hours.   PRENATAL VITAMINS PO Take 1 tablet by mouth daily.      Postpartum contraception: Nexplanon Diet: Routine Diet Activity: Advance as tolerated. Pelvic rest for 6 weeks.   Outpatient follow GN:FAOZHY 1 week for blood pressure, 4-6 weeks postpartum - patient encouraged to call and make follow-up appointment  Newborn Data: Live born female  Birth Weight: 8 lb 2.5 oz (3700 g) APGAR: 8, 9  Newborn Delivery   Birth date/time:  02/05/2018 14:03:00 Delivery type:  Vaginal, Spontaneous    Baby Feeding: Breast Disposition:home with mother  Cristal Deer. Earlene Plater, DO OB/GYN Fellow, Faculty Practice

## 2018-02-16 ENCOUNTER — Telehealth: Payer: Self-pay | Admitting: *Deleted

## 2018-02-16 NOTE — Telephone Encounter (Addendum)
Pt left message stating she delivered her baby on 11/14. She needs a statement from the doctor for her employer so that she can file FMLA.  Please call back.   11/19 Per chart review, this telephone encounter was routed to Rolan Lipaiana Turner yesterday in order to address pt's request.

## 2018-02-18 ENCOUNTER — Telehealth: Payer: Self-pay | Admitting: Family Medicine

## 2018-02-18 NOTE — Telephone Encounter (Signed)
Spoke with patient about a letter she needed stating that she had her baby on 11/14 and need a physician to sign it. After some talking patient has a physician statement letter that she will bring in to get filled out so that she can turn in.

## 2018-02-24 ENCOUNTER — Telehealth: Payer: Self-pay | Admitting: Family Medicine

## 2018-02-24 NOTE — Telephone Encounter (Signed)
LVM letting pt know that her FMLA have been completed and faxed over. The originals can be picked up at her convenience. Let office number if there were any questions.

## 2018-03-05 DIAGNOSIS — Z029 Encounter for administrative examinations, unspecified: Secondary | ICD-10-CM

## 2018-03-13 DIAGNOSIS — R030 Elevated blood-pressure reading, without diagnosis of hypertension: Secondary | ICD-10-CM | POA: Diagnosis not present

## 2018-03-13 DIAGNOSIS — Z3009 Encounter for other general counseling and advice on contraception: Secondary | ICD-10-CM | POA: Diagnosis not present

## 2018-03-31 DIAGNOSIS — Z30017 Encounter for initial prescription of implantable subdermal contraceptive: Secondary | ICD-10-CM | POA: Diagnosis not present

## 2018-04-08 ENCOUNTER — Other Ambulatory Visit (HOSPITAL_COMMUNITY): Payer: Self-pay | Admitting: Family Medicine

## 2019-11-22 IMAGING — US US MFM OB COMPLETE +14 WKS
1 series · 14 of 28 positions shown · non-contrast
Comparison: none

Health Department
J Luis NP

WEEKS
1  RICKI                [id number]       [id number]     [id number]
YORAI
Indications
13 weeks gestation of pregnancy
Obesity complicating pregnancy, first
trimester (Pre Pregnancy BMI 31.3)
Encounter for uncertain dates
OB History
Blood Type:            Height:  5'8"   Weight (lb):  202       BMI:
Gravidity:    1
Fetal Evaluation
Num Of Fetuses:     1
Fetal Heart         145
Rate(bpm):
Cardiac Activity:   Observed
Presentation:       Breech
Placenta:           Anterior, above cervical os
P. Cord Insertion:  Visualized
Amniotic Fluid
AFI FV:      Subjectively within normal limits
Biometry
BPD:      25.4  mm     G. Age:  14w 3d                  CI:        73.55   %    70 - 86
FL/HC:      13.3   %
HC:       94.1  mm     G. Age:  14w 2d                  HC/AC:      1.19        1.14 -
AC:       79.2  mm     G. Age:  14w 2d                  FL/BPD:     49.2   %
FL:       12.5  mm     G. Age:  13w 5d                  FL/AC:      15.8   %    20 - 24
HUM:      11.9  mm     G. Age:  13w 1d
Est. FW:      89  gm      0 lb 3 oz
Gestational Age
LMP:           13w 6d        Date:  05/16/17                 EDD:   02/20/18
U/S Today:     14w 1d                                        EDD:   02/18/18
Best:          13w 6d     Det. By:  LMP  (05/16/17)          EDD:   02/20/18
Anatomy
Cranium:               Appears normal         Kidneys:                Not well visualized
Choroid Plexus:        Appears normal         Bladder:                Not well visualized
Stomach:               Appears normal, left   Upper Extremities:      Visualized
sided
Abdominal Wall:        Appears nml (cord      Lower Extremities:      Visualized
insert, abd wall)
Cord Vessels:          Appears normal (3
vessel cord)
Cervix Uterus Adnexa
Uterus
No abnormality visualized.
Left Ovary
Size(cm)        4   x   2.4    x  2.9       Vol(ml):
Within normal limits.
Right Ovary
Size(cm)       4.5  x   2.1    x  2.7       Vol(ml):
Impression
INDICATION: 27 yr old G1P0 at 55w1d presents for first
trimester screen.

[Series 1: us mfm ob complete +14 wks · 14 of 42 slices shown]
[im 2/42]
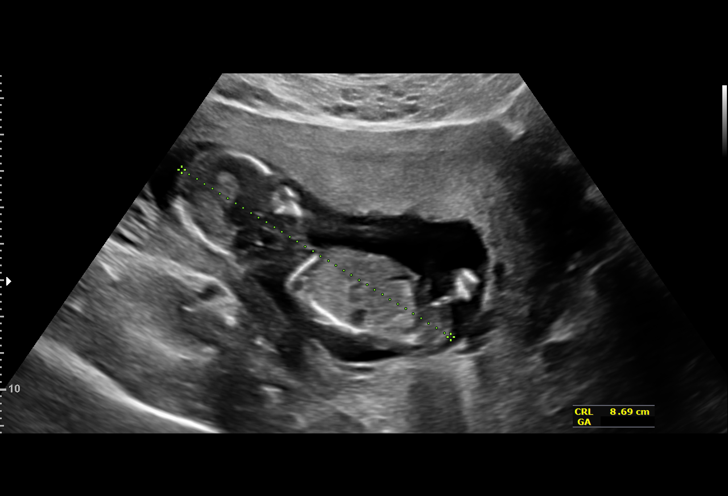
[im 5/42]
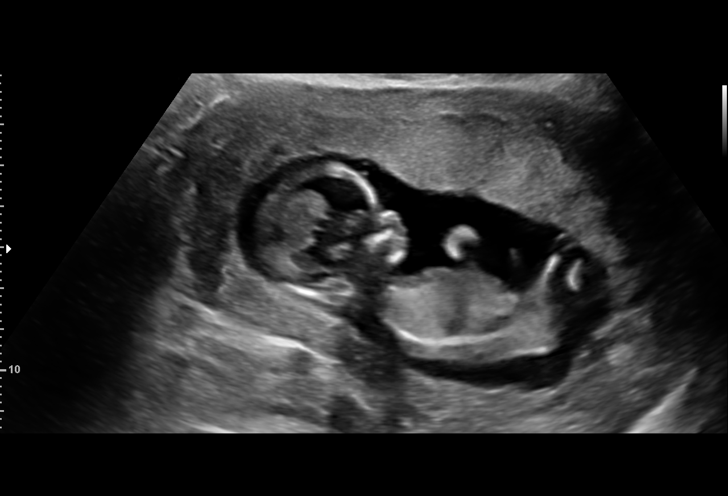
[im 8/42]
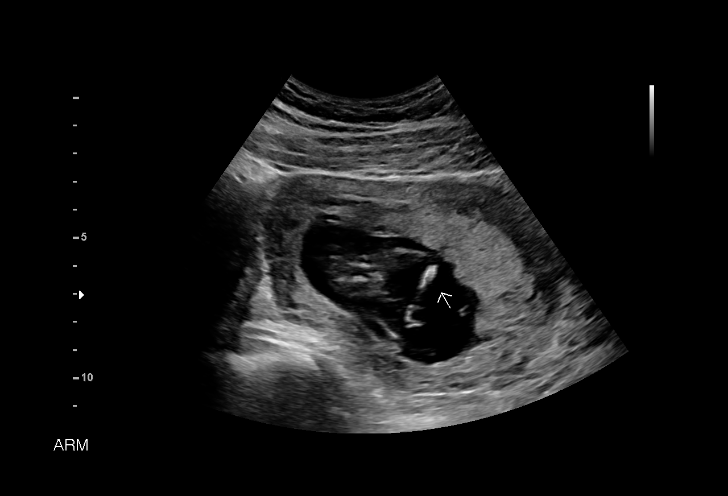
[im 11/42]
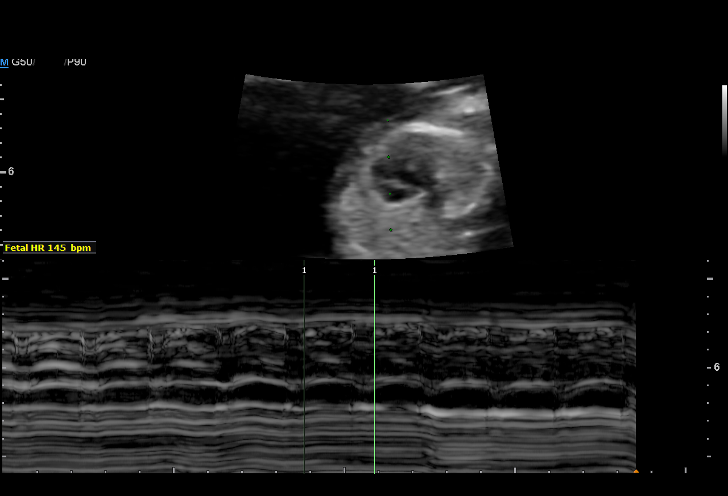
[im 14/42]
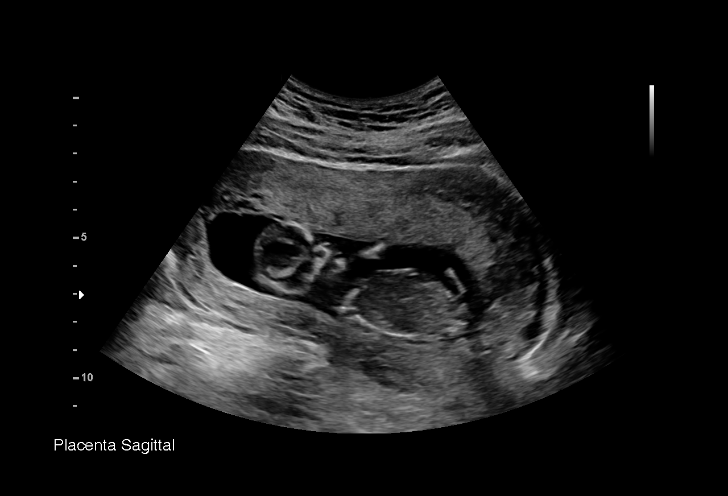
[im 17/42]
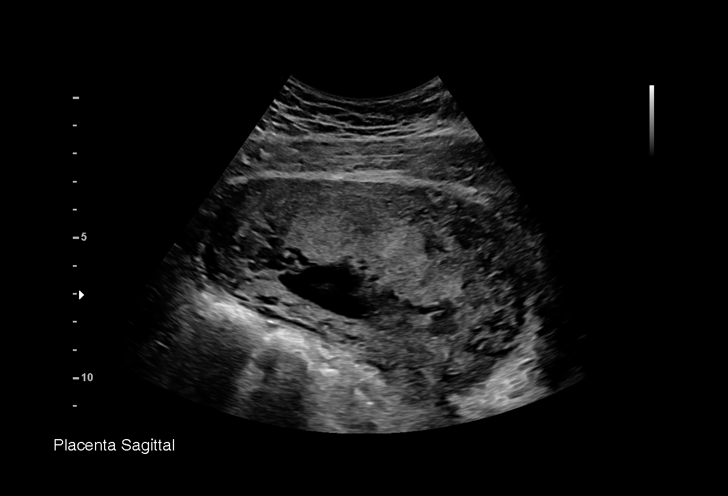
[im 20/42]
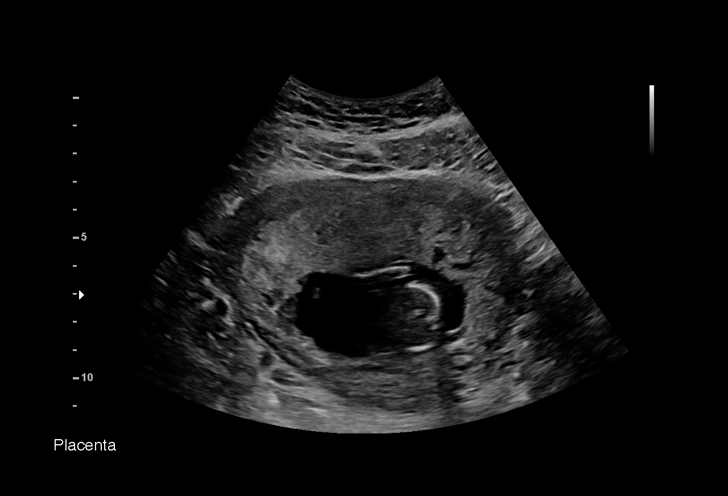
[im 23/42]
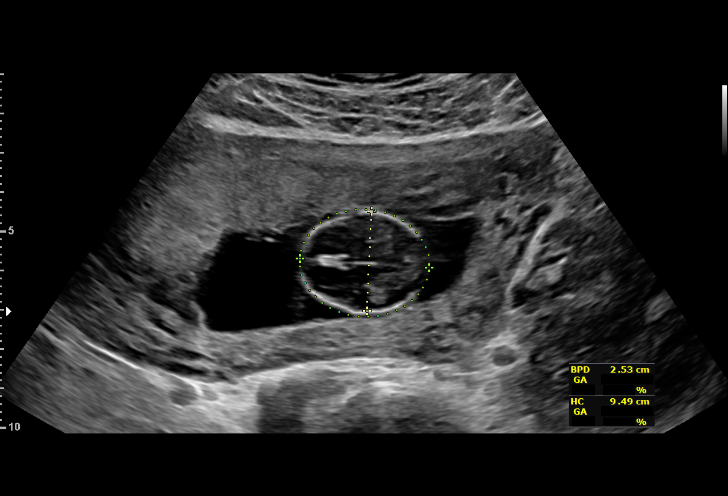
[im 26/42]
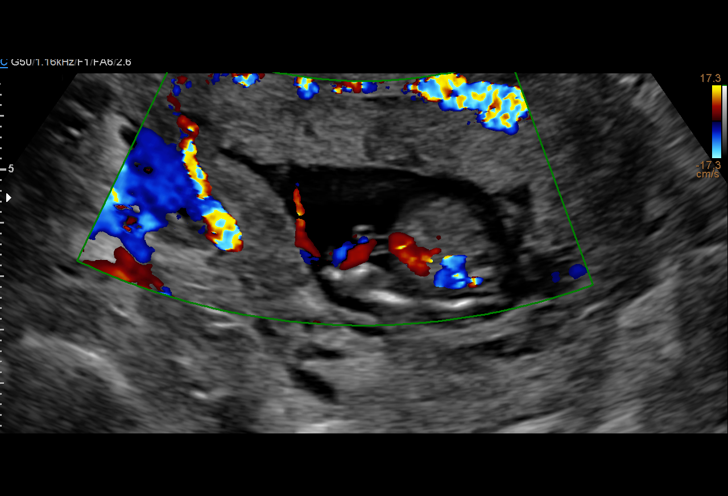
[im 29/42]
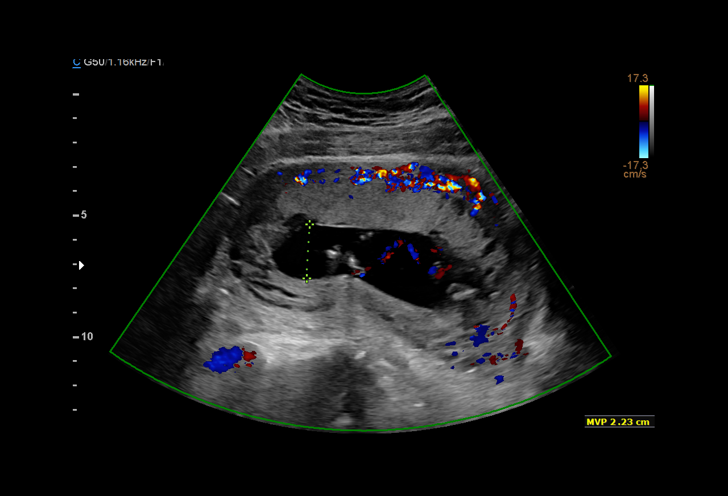
[im 32/42]
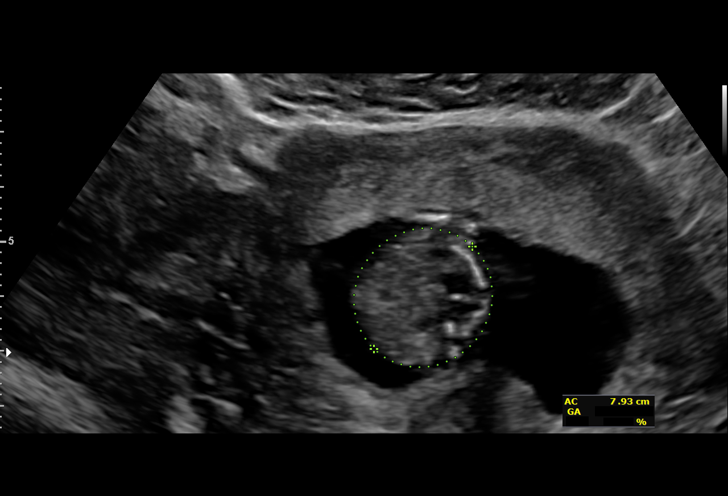
[im 35/42]
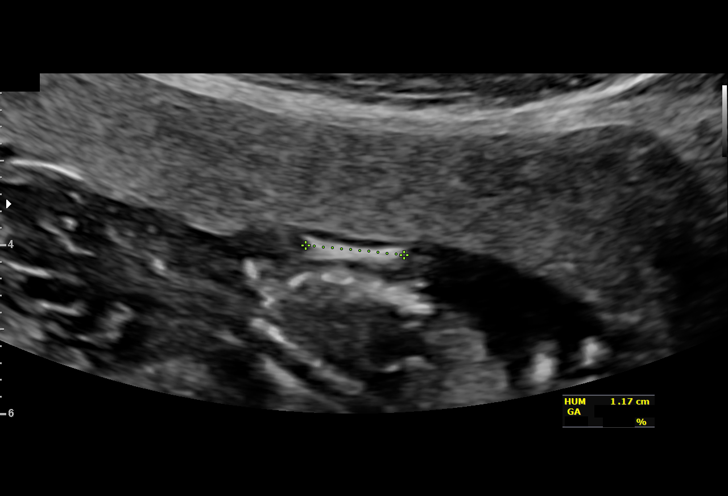
[im 38/42]
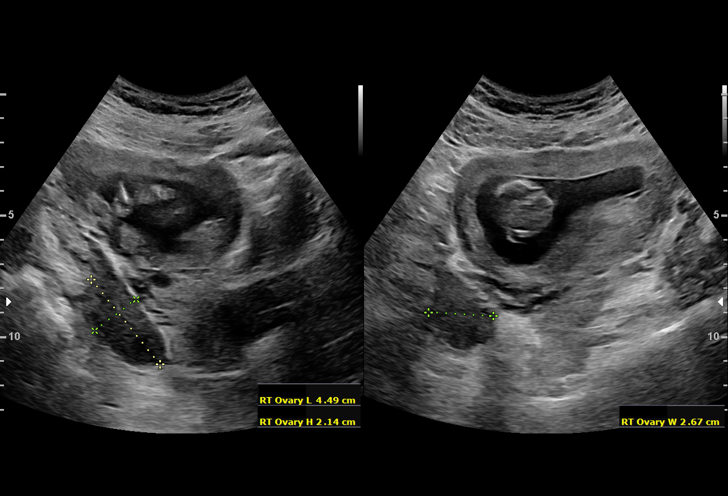
[im 42/42]
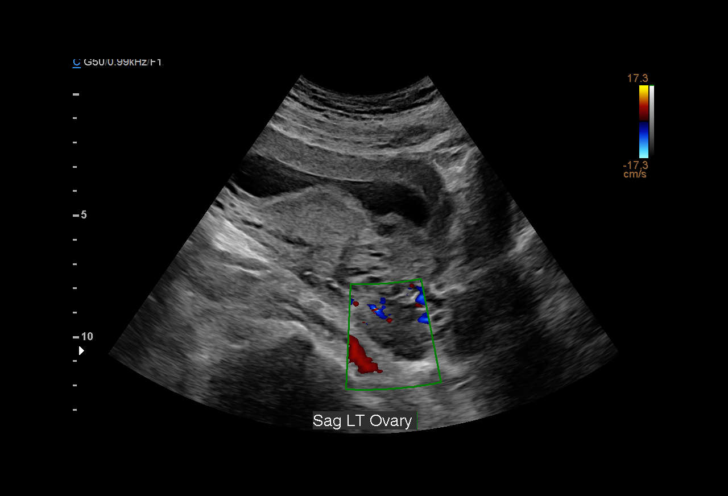

[14 of 28 positions shown; findings below may reference images not displayed]

FINDINGS: 1. Single intrauterine pregnancy with normal cardiac activity.
2. Fetal biometry is consistent with dating; however crown
rump length is out of range for first trimester screen.
3. Normal uterus; no adnexal masses seen.
4. Limited anatomy survey is normal as above; limited by
early gestational age.
Recommendations

1. Appropriate dating.
2. First trimester screen could not be done today:
- discussed option of quad screen from 15-20 weeks
- patient would like this test- will be done with primary OB
- discussed limitations of screening tests in detecting fetal
aneuploidy
3. Recommend fetal anatomic survey at 18-20 weeks
